# Patient Record
Sex: Female | Born: 1985 | Race: Black or African American | Hispanic: No | Marital: Single | Smoking: Current every day smoker
Health system: Southern US, Community
[De-identification: ages and names within clinical notes are randomized; demographics above are authoritative.]

## PROBLEM LIST (undated history)

## (undated) DIAGNOSIS — F419 Anxiety disorder, unspecified: Secondary | ICD-10-CM

## (undated) DIAGNOSIS — F319 Bipolar disorder, unspecified: Secondary | ICD-10-CM

## (undated) DIAGNOSIS — F32A Depression, unspecified: Secondary | ICD-10-CM

## (undated) DIAGNOSIS — I10 Essential (primary) hypertension: Secondary | ICD-10-CM

## (undated) DIAGNOSIS — D649 Anemia, unspecified: Secondary | ICD-10-CM

## (undated) DIAGNOSIS — F329 Major depressive disorder, single episode, unspecified: Secondary | ICD-10-CM

## (undated) HISTORY — DX: Bipolar disorder, unspecified: F31.9

## (undated) HISTORY — PX: DILATION AND CURETTAGE OF UTERUS: SHX78

---
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
## 2005-04-04 ENCOUNTER — Emergency Department: Payer: Self-pay | Admitting: Emergency Medicine

## 2005-06-20 ENCOUNTER — Emergency Department: Payer: Self-pay | Admitting: Emergency Medicine

## 2005-09-18 ENCOUNTER — Emergency Department: Payer: Self-pay | Admitting: Emergency Medicine

## 2006-06-23 ENCOUNTER — Emergency Department: Payer: Self-pay | Admitting: Internal Medicine

## 2006-07-12 ENCOUNTER — Emergency Department: Payer: Self-pay

## 2006-09-09 ENCOUNTER — Emergency Department: Payer: Self-pay | Admitting: Emergency Medicine

## 2007-01-07 ENCOUNTER — Emergency Department: Payer: Self-pay | Admitting: Emergency Medicine

## 2007-01-08 ENCOUNTER — Emergency Department: Payer: Self-pay | Admitting: Emergency Medicine

## 2007-09-08 ENCOUNTER — Emergency Department: Payer: Self-pay | Admitting: Emergency Medicine

## 2008-01-05 ENCOUNTER — Ambulatory Visit: Payer: Self-pay | Admitting: Internal Medicine

## 2008-04-27 ENCOUNTER — Observation Stay: Payer: Self-pay | Admitting: Obstetrics and Gynecology

## 2008-07-20 ENCOUNTER — Ambulatory Visit: Payer: Self-pay | Admitting: Urology

## 2008-08-26 ENCOUNTER — Observation Stay: Payer: Self-pay

## 2008-09-06 ENCOUNTER — Inpatient Hospital Stay: Payer: Self-pay

## 2008-09-21 ENCOUNTER — Ambulatory Visit: Payer: Self-pay

## 2008-09-23 ENCOUNTER — Ambulatory Visit: Payer: Self-pay | Admitting: Obstetrics & Gynecology

## 2008-09-24 ENCOUNTER — Ambulatory Visit: Payer: Self-pay | Admitting: Obstetrics & Gynecology

## 2009-08-15 ENCOUNTER — Emergency Department: Payer: Self-pay | Admitting: Internal Medicine

## 2010-05-27 ENCOUNTER — Emergency Department: Payer: Self-pay | Admitting: Emergency Medicine

## 2010-11-19 HISTORY — PX: ANKLE INCISION AND DRAINAGE: SHX1150

## 2010-12-04 ENCOUNTER — Emergency Department: Payer: Self-pay | Admitting: Emergency Medicine

## 2010-12-06 ENCOUNTER — Inpatient Hospital Stay: Payer: Self-pay | Admitting: Internal Medicine

## 2010-12-19 ENCOUNTER — Emergency Department: Payer: Self-pay | Admitting: Unknown Physician Specialty

## 2010-12-20 ENCOUNTER — Emergency Department: Payer: Self-pay | Admitting: *Deleted

## 2012-02-15 ENCOUNTER — Emergency Department: Payer: Self-pay | Admitting: Emergency Medicine

## 2012-04-24 ENCOUNTER — Emergency Department: Payer: Self-pay | Admitting: Emergency Medicine

## 2012-04-24 LAB — CBC
HCT: 41.9 % (ref 35.0–47.0)
HGB: 14.5 g/dL (ref 12.0–16.0)
MCH: 32.3 pg (ref 26.0–34.0)
MCHC: 34.5 g/dL (ref 32.0–36.0)
MCV: 94 fL (ref 80–100)
Platelet: 187 10*3/uL (ref 150–440)
RBC: 4.48 10*6/uL (ref 3.80–5.20)
RDW: 13.7 % (ref 11.5–14.5)
WBC: 9.8 10*3/uL (ref 3.6–11.0)

## 2012-04-24 LAB — URINALYSIS, COMPLETE
Bilirubin,UR: NEGATIVE
Blood: NEGATIVE
Glucose,UR: NEGATIVE mg/dL (ref 0–75)
Nitrite: NEGATIVE
Ph: 8 (ref 4.5–8.0)
RBC,UR: 3 /HPF (ref 0–5)
Squamous Epithelial: 14

## 2013-06-15 ENCOUNTER — Emergency Department: Payer: Self-pay | Admitting: Emergency Medicine

## 2013-06-15 LAB — CBC WITH DIFFERENTIAL/PLATELET
Basophil #: 0.1 10*3/uL (ref 0.0–0.1)
Basophil %: 0.8 %
Eosinophil #: 0.2 10*3/uL (ref 0.0–0.7)
Eosinophil %: 3.1 %
HCT: 42.1 % (ref 35.0–47.0)
HGB: 14.2 g/dL (ref 12.0–16.0)
LYMPHS PCT: 30 %
Lymphocyte #: 2.4 10*3/uL (ref 1.0–3.6)
MCH: 32 pg (ref 26.0–34.0)
MCHC: 33.8 g/dL (ref 32.0–36.0)
MCV: 95 fL (ref 80–100)
MONOS PCT: 6.5 %
Monocyte #: 0.5 x10 3/mm (ref 0.2–0.9)
NEUTROS PCT: 59.6 %
Neutrophil #: 4.7 10*3/uL (ref 1.4–6.5)
Platelet: 156 10*3/uL (ref 150–440)
RBC: 4.44 10*6/uL (ref 3.80–5.20)
RDW: 13.1 % (ref 11.5–14.5)
WBC: 7.8 10*3/uL (ref 3.6–11.0)

## 2013-06-15 LAB — COMPREHENSIVE METABOLIC PANEL
ALBUMIN: 4.3 g/dL (ref 3.4–5.0)
Alkaline Phosphatase: 47 U/L
Anion Gap: 5 — ABNORMAL LOW (ref 7–16)
BUN: 11 mg/dL (ref 7–18)
Bilirubin,Total: 0.4 mg/dL (ref 0.2–1.0)
CALCIUM: 9 mg/dL (ref 8.5–10.1)
Chloride: 108 mmol/L — ABNORMAL HIGH (ref 98–107)
Co2: 25 mmol/L (ref 21–32)
Creatinine: 0.96 mg/dL (ref 0.60–1.30)
EGFR (African American): >60
Glucose: 85 mg/dL (ref 65–99)
Osmolality: 274 (ref 275–301)
Potassium: 3.8 mmol/L (ref 3.5–5.1)
SGOT(AST): 16 U/L (ref 15–37)
SGPT (ALT): 33 U/L (ref 12–78)
SODIUM: 138 mmol/L (ref 136–145)
TOTAL PROTEIN: 7.4 g/dL (ref 6.4–8.2)

## 2013-06-15 LAB — GC/CHLAMYDIA PROBE AMP

## 2013-06-15 LAB — URINALYSIS, COMPLETE
Bacteria: NONE SEEN
Bilirubin,UR: NEGATIVE
Blood: NEGATIVE
Glucose,UR: NEGATIVE mg/dL (ref 0–75)
Ketone: NEGATIVE
Leukocyte Esterase: NEGATIVE
NITRITE: NEGATIVE
PH: 6 (ref 4.5–8.0)
PROTEIN: NEGATIVE
RBC,UR: <1 /HPF (ref 0–5)
Specific Gravity: 1.017 (ref 1.003–1.030)
WBC UR: 2 /HPF (ref 0–5)

## 2013-06-15 LAB — WET PREP, GENITAL

## 2013-06-15 LAB — HCG, QUANTITATIVE, PREGNANCY: BETA HCG, QUANT.: 435 m[IU]/mL — AB

## 2013-08-06 ENCOUNTER — Encounter: Payer: Self-pay | Admitting: Obstetrics & Gynecology

## 2013-08-06 LAB — CBC WITH DIFFERENTIAL/PLATELET
Basophil #: 0 10*3/uL (ref 0.0–0.1)
Basophil %: 0.4 %
EOS ABS: 0.4 10*3/uL (ref 0.0–0.7)
Eosinophil %: 3.5 %
HCT: 41.3 % (ref 35.0–47.0)
HGB: 14.1 g/dL (ref 12.0–16.0)
LYMPHS PCT: 12 %
Lymphocyte #: 1.3 10*3/uL (ref 1.0–3.6)
MCH: 31.8 pg (ref 26.0–34.0)
MCHC: 34.1 g/dL (ref 32.0–36.0)
MCV: 93 fL (ref 80–100)
Monocyte #: 0.4 x10 3/mm (ref 0.2–0.9)
Monocyte %: 4 %
NEUTROS ABS: 8.5 10*3/uL — AB (ref 1.4–6.5)
Neutrophil %: 80.1 %
Platelet: 165 10*3/uL (ref 150–440)
RBC: 4.44 10*6/uL (ref 3.80–5.20)
RDW: 13.3 % (ref 11.5–14.5)
WBC: 10.6 10*3/uL (ref 3.6–11.0)

## 2013-08-11 ENCOUNTER — Ambulatory Visit: Payer: Self-pay | Admitting: Obstetrics and Gynecology

## 2013-08-11 LAB — CBC WITH DIFFERENTIAL/PLATELET
Basophil #: 0 10*3/uL (ref 0.0–0.1)
Basophil %: 0.4 %
EOS PCT: 1 %
Eosinophil #: 0.1 10*3/uL (ref 0.0–0.7)
HCT: 39.5 % (ref 35.0–47.0)
HGB: 13.7 g/dL (ref 12.0–16.0)
LYMPHS ABS: 1.4 10*3/uL (ref 1.0–3.6)
Lymphocyte %: 13.9 %
MCH: 32 pg (ref 26.0–34.0)
MCHC: 34.7 g/dL (ref 32.0–36.0)
MCV: 92 fL (ref 80–100)
MONO ABS: 0.5 x10 3/mm (ref 0.2–0.9)
MONOS PCT: 5 %
NEUTROS PCT: 79.7 %
Neutrophil #: 8 10*3/uL — ABNORMAL HIGH (ref 1.4–6.5)
Platelet: 173 10*3/uL (ref 150–440)
RBC: 4.28 10*6/uL (ref 3.80–5.20)
RDW: 13.1 % (ref 11.5–14.5)
WBC: 10 10*3/uL (ref 3.6–11.0)

## 2013-08-11 LAB — BASIC METABOLIC PANEL
ANION GAP: 7 (ref 7–16)
BUN: 8 mg/dL (ref 7–18)
Calcium, Total: 8.8 mg/dL (ref 8.5–10.1)
Chloride: 104 mmol/L (ref 98–107)
Co2: 25 mmol/L (ref 21–32)
Creatinine: 0.8 mg/dL (ref 0.60–1.30)
EGFR (African American): >60
Glucose: 96 mg/dL (ref 65–99)
Osmolality: 270 (ref 275–301)
Potassium: 3.2 mmol/L — ABNORMAL LOW (ref 3.5–5.1)
Sodium: 136 mmol/L (ref 136–145)

## 2013-08-11 LAB — HCG, QUANTITATIVE, PREGNANCY: Beta Hcg, Quant.: 2991 m[IU]/mL — ABNORMAL HIGH

## 2013-08-15 LAB — PATHOLOGY REPORT

## 2014-01-26 ENCOUNTER — Encounter
Admit: 2014-01-26 | Disposition: A | Discharge status: Home / Self Care | Payer: Self-pay | Admitting: Obstetrics & Gynecology

## 2014-04-07 LAB — OB RESULTS CONSOLE GBS: STREP GROUP B AG: POSITIVE

## 2014-04-14 LAB — OB RESULTS CONSOLE PLATELET COUNT: Platelets: 218 10*3/uL

## 2014-04-14 LAB — OB RESULTS CONSOLE RUBELLA ANTIBODY, IGM: RUBELLA: NON-IMMUNE/NOT IMMUNE

## 2014-04-14 LAB — OB RESULTS CONSOLE GC/CHLAMYDIA
Chlamydia: NEGATIVE
GC PROBE AMP, GENITAL: NEGATIVE

## 2014-04-14 LAB — OB RESULTS CONSOLE ABO/RH: RH Type: POSITIVE

## 2014-04-14 LAB — OB RESULTS CONSOLE VARICELLA ZOSTER ANTIBODY, IGG: Varicella: IMMUNE

## 2014-04-14 LAB — OB RESULTS CONSOLE HIV ANTIBODY (ROUTINE TESTING): HIV: NONREACTIVE

## 2014-04-14 LAB — OB RESULTS CONSOLE ANTIBODY SCREEN: Antibody Screen: NEGATIVE

## 2014-04-14 LAB — OB RESULTS CONSOLE RPR: RPR: NONREACTIVE

## 2014-04-14 LAB — OB RESULTS CONSOLE HEPATITIS B SURFACE ANTIGEN: Hepatitis B Surface Ag: NEGATIVE

## 2014-05-21 NOTE — L&D Delivery Note (Signed)
Delivery Note At 4:12 AM a viable female was delivered via Vaginal, Spontaneous Delivery (Presentation: Left Occiput Anterior).  APGAR: 8, 9; weight 2800 g (6 lbs 2 oz) .   Placenta status: Piecemeal, Spontaneous removal of majority of placenta.  Cord avulsion after placenta partially beyond introitus.  Small segment missing.  Bedside ultrasound performed with confirmation of small segment of placenta at LUS. Manual removal.  Cytotec 800 mcg placed rectally. Cord: 3 vessels.  Anesthesia: Epidural  Episiotomy: None Lacerations: None Suture Repair: none Est. Blood Loss (mL): 400  Mom to postpartum.  Baby to Couplet care / Skin to Skin.  Mettie Roylance 10/06/2014, 5:04 AM

## 2014-05-25 ENCOUNTER — Emergency Department: Payer: Self-pay | Admitting: Emergency Medicine

## 2014-05-25 LAB — COMPREHENSIVE METABOLIC PANEL
ALBUMIN: 3 g/dL — AB (ref 3.4–5.0)
ALK PHOS: 49 U/L
ANION GAP: 2 — AB (ref 7–16)
AST: 11 U/L — AB (ref 15–37)
BUN: 6 mg/dL — AB (ref 7–18)
Bilirubin,Total: 0.2 mg/dL (ref 0.2–1.0)
CO2: 26 mmol/L (ref 21–32)
CREATININE: 0.71 mg/dL (ref 0.60–1.30)
Calcium, Total: 8 mg/dL — ABNORMAL LOW (ref 8.5–10.1)
Chloride: 107 mmol/L (ref 98–107)
EGFR (African American): >60
EGFR (Non-African Amer.): >60
Glucose: 76 mg/dL (ref 65–99)
Osmolality: 266 (ref 275–301)
Potassium: 3.9 mmol/L (ref 3.5–5.1)
SGPT (ALT): 18 U/L
SODIUM: 135 mmol/L — AB (ref 136–145)
Total Protein: 6.2 g/dL — ABNORMAL LOW (ref 6.4–8.2)

## 2014-05-25 LAB — URINALYSIS, COMPLETE
BACTERIA: NONE SEEN
BILIRUBIN, UR: NEGATIVE
BLOOD: NEGATIVE
Glucose,UR: NEGATIVE mg/dL (ref 0–75)
Ketone: NEGATIVE
Nitrite: NEGATIVE
Ph: 6 (ref 4.5–8.0)
Protein: NEGATIVE
Specific Gravity: 1.021 (ref 1.003–1.030)
WBC UR: 1 /HPF (ref 0–5)

## 2014-05-25 LAB — CBC WITH DIFFERENTIAL/PLATELET
BASOS PCT: 0.5 %
Basophil #: 0.1 10*3/uL (ref 0.0–0.1)
Eosinophil #: 0.2 10*3/uL (ref 0.0–0.7)
Eosinophil %: 2 %
HCT: 36.1 % (ref 35.0–47.0)
HGB: 12.1 g/dL (ref 12.0–16.0)
Lymphocyte #: 1.6 10*3/uL (ref 1.0–3.6)
Lymphocyte %: 16.5 %
MCH: 32.1 pg (ref 26.0–34.0)
MCHC: 33.6 g/dL (ref 32.0–36.0)
MCV: 96 fL (ref 80–100)
MONOS PCT: 5.3 %
Monocyte #: 0.5 x10 3/mm (ref 0.2–0.9)
NEUTROS ABS: 7.3 10*3/uL — AB (ref 1.4–6.5)
NEUTROS PCT: 75.7 %
Platelet: 154 10*3/uL (ref 150–440)
RBC: 3.78 10*6/uL — AB (ref 3.80–5.20)
RDW: 13 % (ref 11.5–14.5)
WBC: 9.7 10*3/uL (ref 3.6–11.0)

## 2014-05-25 LAB — LIPASE, BLOOD: LIPASE: 168 U/L (ref 73–393)

## 2014-05-31 ENCOUNTER — Emergency Department: Payer: Self-pay | Admitting: Emergency Medicine

## 2014-05-31 LAB — CBC
HCT: 36.5 % (ref 35.0–47.0)
HGB: 12.1 g/dL (ref 12.0–16.0)
MCH: 31.5 pg (ref 26.0–34.0)
MCHC: 33.3 g/dL (ref 32.0–36.0)
MCV: 95 fL (ref 80–100)
Platelet: 156 10*3/uL (ref 150–440)
RBC: 3.85 10*6/uL (ref 3.80–5.20)
RDW: 13.2 % (ref 11.5–14.5)
WBC: 13 10*3/uL — AB (ref 3.6–11.0)

## 2014-05-31 LAB — BASIC METABOLIC PANEL
Anion Gap: 7 (ref 7–16)
BUN: 7 mg/dL (ref 7–18)
CALCIUM: 8 mg/dL — AB (ref 8.5–10.1)
Chloride: 107 mmol/L (ref 98–107)
Co2: 23 mmol/L (ref 21–32)
Creatinine: 0.73 mg/dL (ref 0.60–1.30)
EGFR (African American): >60
EGFR (Non-African Amer.): >60
GLUCOSE: 81 mg/dL (ref 65–99)
Osmolality: 271 (ref 275–301)
POTASSIUM: 3.5 mmol/L (ref 3.5–5.1)
Sodium: 137 mmol/L (ref 136–145)

## 2014-05-31 LAB — WET PREP, GENITAL

## 2014-06-01 LAB — GC/CHLAMYDIA PROBE AMP

## 2014-06-05 ENCOUNTER — Observation Stay: Payer: Self-pay | Admitting: Obstetrics and Gynecology

## 2014-09-11 NOTE — Op Note (Signed)
PATIENT NAME:  Debbie Padilla, [name] MR#:  [id number] DATE OF BIRTH:  1985/10/22  DATE OF PROCEDURE:  08/11/2013  PREOPERATIVE DIAGNOSIS: Missed spontaneous abortion.   POSTOPERATIVE DIAGNOSIS: Missed spontaneous abortion.   PROCEDURE: Suction dilation and curettage.   ANESTHESIA: General.   SURGEON: Thomasene MohairStephen Roslyn Else, MD   ESTIMATED BLOOD LOSS: 100 mL.  OPERATIVE FLUIDS: 500 mL crystalloid.   COMPLICATIONS: None.   FINDINGS: An approximately 8 week size uterus with apparent products of conception.   SPECIMENS: Endometrial curettings and apparent products of conception.   CONDITION AT END OF PROCEDURE: Stable.   PROCEDURE IN DETAIL: The patient was taken to the operating room and placed under general anesthesia, which was found to be adequate. She was placed in the dorsal supine high lithotomy position and prepped and draped in the usual sterile fashion. After a timeout was called, an in-and-out catheterization was performed for minimal return of approximately 5 mL of clear urine. A sterile speculum was placed in the vagina and a single-tooth tenaculum was used to grasp the anterior lip of the cervix. The size of the uterus was consistent with an 8 week size uterus and an 8 mm suction curette was then gently introduced through the cervix after it was dilated using Hegar dilators to approximately 9 mm without difficulty. Suction curette was then used for 3 passes with good return of products. A rigid curette was then used throughout the uterine cavity until a gritty texture was noted throughout. One final pass of the suction curette was utilized to obtain any loosened up tissue after the rigid curette. This concluded the procedure. There was good hemostasis with only minimal bleeding from the cervical os. The single-tooth tenaculum was removed from the cervix, and these sites were hemostatic. The vagina was inspected, and no instruments or sponges were left inside the vagina. The speculum was then  removed.   The patient tolerated the procedure well. Sponge, lap, and needle counts were correct x 2. For antibiotic prophylaxis, the patient received 100 mg of IV doxycycline on call to the OR. She was awakened in the operating room and taken to the recovery area in stable condition.     ____________________________ Conard NovakStephen D. Sidnee Gambrill, MD sdj:jcm D: 08/11/2013 15:16:06 ET T: 08/11/2013 17:23:04 ET JOB#: [id number]  cc: Conard NovakStephen D. Ra Pfiester, MD, <Dictator> Conard NovakSTEPHEN D Matheu Ploeger MD ELECTRONICALLY SIGNED 08/15/2013 8:23

## 2014-09-11 NOTE — H&P (Signed)
Subjective/Chief Complaint Vaginal Bleeding   History of Present Illness 29 yo [id number] at approximately [redacted] weeks gestational age presenting to ER for vaginal bleeding after going to restroom to urinate this morning.  She had one gush, then a clump of blood.  She has been passing some small amounts of blood since then.  She notes ongoing cramping since that time.  She has previously had a anembryonic pregnancy diagnosed and was scheduled to undergo suction dilation and curettage today with Dr. Bonney Aid.  Due to the above, she presented to the emergency room at [facility] instead in the hopes that she had already passed the pregnancy at home and would not require a surgical procedure.   Past History endometriosis   Past Med/Surgical Hx:  Endometriosis:   D&C:   ALLERGIES:  No Known Allergies:    Medications No medications   Family and Social History:  Family History Non-Contributory   Social History positive  tobacco, 5 cigs/day, occasional EtOH, no drug use   + Tobacco Current (within 1 year)   Review of Systems:  Fever/Chills No   Cough No   Sputum No   Abdominal Pain No   Diarrhea No   Constipation No   Nausea/Vomiting No   SOB/DOE No   Chest Pain No   Dysuria No   Physical Exam:  GEN well developed, well nourished, no acute distress   NECK supple  No masses  trachea midline   RESP normal resp effort  clear BS  no use of accessory muscles   CARD regular rate  no murmur  regular rhythm   ABD denies tenderness  soft  normal BS  no Adominal Mass   GU no superpubic tenderness   EXTR negative cyanosis/clubbing, negative edema   SKIN normal to palpation   NEURO motor/sensory function intact   PSYCH A+O to time, place, person, good insight   Additional Comments Temperature : 98.4 degrees F Temp Source : oral Pulse : 68  Respirations : 18  SBP : 109  DBP : 52  Pulse Ox % : 98 % Pulse Ox Source Source : Room Air Pain Scale (0-10) : Scale:9   Lab  Results: Routine BB:  24-Mar-15 11:12   ABO Group + Rh Type O Positive  Antibody Screen NEGATIVE (Result(s) reported on 11 Aug 2013 at 12:19PM.)  Routine Chem:  24-Mar-15 10:00   HCG Betasubunit Quant. Serum  2991 (1-3  (International Unit)  ----------------- Non-pregnant <5 Weeks Post LMP mIU/mL  3- 4 wk 9 - 130  4- 5 wk 75 - 2,600  5- 6 wk 850 - 20,800  6- 7 wk 4,000 - 100,000  7-12 wk 11,500 - 289,000 12-16 wk 18,000 - 137,000 16-29 wk 1,400 - 53,000 29-41 wk 940 - 60,000)  Glucose, Serum 96  BUN 8  Creatinine (comp) 0.80  Sodium, Serum 136  Potassium, Serum  3.2  Chloride, Serum 104  CO2, Serum 25  Calcium (Total), Serum 8.8  Anion Gap 7  Osmolality (calc) 270  eGFR (African American) >60  eGFR (Non-African American) >60 (eGFR values <71mL/min/1.73 m2 may be an indication of chronic kidney disease (CKD). Calculated eGFR is useful in patients with stable renal function. The eGFR calculation will not be reliable in acutely ill patients when serum creatinine is changing rapidly. It is not useful in  patients on dialysis. The eGFR calculation may not be applicable to patients at the low and high extremes of body sizes, pregnant women, and vegetarians.)  Routine  Hem:  24-Mar-15 10:00   WBC (CBC) 10.0  RBC (CBC) 4.28  Hemoglobin (CBC) 13.7  Hematocrit (CBC) 39.5  Platelet Count (CBC) 173  MCV 92  MCH 32.0  MCHC 34.7  RDW 13.1  Neutrophil % 79.7  Lymphocyte % 13.9  Monocyte % 5.0  Eosinophil % 1.0  Basophil % 0.4  Neutrophil #  8.0  Lymphocyte # 1.4  Monocyte # 0.5  Eosinophil # 0.1  Basophil # 0.0 (Result(s) reported on 11 Aug 2013 at 11:18AM.)   Radiology Results: [place]:    24-Mar-15 13:00, US OB Less Than 14 Weeks  US OB Less Than 14 Weeks  REASON FOR EXAM:    possible miscarriage  COMMENTS:       PROCEDURE: [place]  - US OB LESS THAN 14 WEEKS  - Aug 11 2013  1:00PM     CLINICAL DATA:  Miscarriage. Scheduled for D and C today, evaluate  for retained  products.    EXAM:  OBSTETRIC <14 WK ULTRASOUND    TECHNIQUE:  Transabdominal ultrasound was performed for evaluation of the  gestation as well as the maternal uterus and adnexal regions.  COMPARISON:  Outside Pelvic ultrasound 08/06/2013    FINDINGS:  Intrauterine gestational sac: Present.  The sac shape is irregular.    Yolk sac:  Not seen    Fetal pole: Not seen.    MSD: 32  mm   8 w   3  d    Maternal uterus/adnexae: Symmetric and normal size ovaries. No  significant free pelvic fluid. No subchorionic hematoma.     IMPRESSION:  Empty intrauterine gestational sac, 32 mm mean sac diameter.  Findings meet definitive criteria for failed pregnancy. This follows  SRU consensus guidelines: Diagnostic Criteria for Nonviable  Pregnancy Early in the First Trimester. Alison Stalling J Med  [phone].      Electronically Signed    By: Jorje Guild M.D.    On: 08/11/2013 13:06         Verified By: Gilford Silvius, M.D.,    Assessment/Admission Diagnosis 29 yo [phone] with anembryonic pregnancy and missed abortion   Plan Discussed managment strategies with patient, including; no medical intervention and allow her body to naturally pass the pregnancy, medication-induced passage of products of conception, and surgical with dilation and curettage.  After thoroughly discussing the risks/benefits of each approach patient elects surgical management.  I discussed all risks, including risk of uterine perforation potentially requiring additional surgery and discussed formation of adhesions in uterus leading to future issues with becoming pregnant and with menstrual irregularities.  I also consented the patient for a blood transfusion in an emergent situation, should that situation arise.  Patient voiced desire to receive transfusion should need arise.  Patient is NPO. Will take to OR. OR/anesthesia is aware.   Electronic Signatures: Will Bonnet (MD)  (Signed 24-Mar-15  14:09)  Authored: CHIEF COMPLAINT and HISTORY, PAST MEDICAL/SURGIAL HISTORY, ALLERGIES, OTHER MEDICATIONS, FAMILY AND SOCIAL HISTORY, REVIEW OF SYSTEMS, PHYSICAL EXAM, LABS, Radiology, ASSESSMENT AND PLAN   Last Updated: 24-Mar-15 14:09 by Will Bonnet (MD)

## 2014-09-13 ENCOUNTER — Observation Stay
Admit: 2014-09-13 | Disposition: A | Discharge status: Home / Self Care | Payer: Self-pay | Attending: Obstetrics and Gynecology | Admitting: Obstetrics and Gynecology

## 2014-09-13 LAB — PIH PROFILE
ANION GAP: 8 (ref 7–16)
CREATININE: 0.52 mg/dL
Calcium, Total: 8.5 mg/dL — ABNORMAL LOW
Chloride: 106 mmol/L
Co2: 19 mmol/L — ABNORMAL LOW
EGFR (Non-African Amer.): >60
Glucose: 85 mg/dL
HCT: 33.8 % — ABNORMAL LOW (ref 35.0–47.0)
HGB: 11.6 g/dL — AB (ref 12.0–16.0)
MCH: 32 pg (ref 26.0–34.0)
MCHC: 34.4 g/dL (ref 32.0–36.0)
MCV: 93 fL (ref 80–100)
POTASSIUM: 3.9 mmol/L
Platelet: 170 10*3/uL (ref 150–440)
RBC: 3.63 10*6/uL — ABNORMAL LOW (ref 3.80–5.20)
RDW: 13.1 % (ref 11.5–14.5)
SGOT(AST): 19 U/L
SODIUM: 133 mmol/L — AB
Uric Acid: 3.5 mg/dL
WBC: 12.3 10*3/uL — ABNORMAL HIGH (ref 3.6–11.0)

## 2014-09-13 LAB — URINALYSIS, COMPLETE
BLOOD: NEGATIVE
Bilirubin,UR: NEGATIVE
Glucose,UR: NEGATIVE mg/dL (ref 0–75)
NITRITE: NEGATIVE
PH: 7 (ref 4.5–8.0)
Protein: NEGATIVE
Specific Gravity: 1.013 (ref 1.003–1.030)

## 2014-09-13 LAB — PROTEIN / CREATININE RATIO, URINE
CREATININE, URINE RANDOM: 65 mg/dL (ref 30–125)
Protein, Urine: 11 mg/dL (ref 0–9)
Protein/Creat. Ratio: 169 mg/gCREAT (ref 0–200)

## 2014-09-28 NOTE — H&P (Signed)
L&D Evaluation:  History:  HPI Debbie Padilla is a 29 y.o. [id number] single AAF, LMP 01/15/2014, EGA 20.1 weeks, EDD 10/22/14 who presents with c/o lower abdominal pain, contractions, and vaginal spotting. Patient has had spotting oer the past week, currently old brown blood. Notes good fetal movement.  Has taken 3 doses of Tylenol at home prior to arrival.   Presents with abdominal pain, contractions   Patient's Medical History Bipolar disorder, h/o UTI in pregnancy   Patient's Surgical History none   Medications Pre Natal Vitamins  Trazadone, Celexa   Allergies NKDA   Social History tobacco  h/o marijuana use prior to pregnancy, 2-3 cig every other day   Family History Non-Contributory   ROS:  ROS All systems were reviewed.  HEENT, CNS, GI, GU, Respiratory, CV, Renal and Musculoskeletal systems were found to be normal.   Exam:  Vital Signs stable   Urine Protein not completed   General mild distress   Mental Status clear   Chest clear   Heart normal sinus rhythm   Abdomen gravid, non-tender   Estimated Fetal Weight Average for gestational age   Back no CVAT   Pelvic no external lesions, cervix closed and thick   Mebranes Intact   FHT FHT appreciated   Ucx absent, uterine irritability noted   Skin dry, no lesions   Impression:  Impression UTI   Plan:  Comments Patient with known UTI diagnosed in office several days ago (EA.Coli).  Patient has not yet initiated treatment.  Will give dose of Rocephin today, 1g, and advised patient to pick up prescriiption for Macrobid at local pharmacy.  Will give dose of Dilaudid 2 mg PO x 1 dose for pain (as patient has taken several doses of Tylenol today, cannot recall timing of last dose). Can d/c home.   Follow Up Appointment in 4 weeks   Electronic Signatures: Fabian Novemberherry, Jekhi Bolin S (MD)  (Signed 16-Jan-16 13:21)  Authored: L&D Evaluation   Last Updated: 16-Jan-16 13:21 by Fabian Novemberherry, Bijou Easler S (MD)

## 2014-10-05 ENCOUNTER — Inpatient Hospital Stay
Admission: RE | Admit: 2014-10-05 | Discharge: 2014-10-08 | DRG: 767 | Disposition: A | Discharge status: Home / Self Care | Payer: Medicaid Other | Attending: Obstetrics and Gynecology | Admitting: Obstetrics and Gynecology

## 2014-10-05 ENCOUNTER — Inpatient Hospital Stay: Payer: Medicaid Other | Admitting: Anesthesiology

## 2014-10-05 DIAGNOSIS — Z3A37 37 weeks gestation of pregnancy: Secondary | ICD-10-CM | POA: Diagnosis present

## 2014-10-05 DIAGNOSIS — O99333 Smoking (tobacco) complicating pregnancy, third trimester: Secondary | ICD-10-CM | POA: Diagnosis present

## 2014-10-05 DIAGNOSIS — Z825 Family history of asthma and other chronic lower respiratory diseases: Secondary | ICD-10-CM

## 2014-10-05 DIAGNOSIS — Z720 Tobacco use: Secondary | ICD-10-CM

## 2014-10-05 DIAGNOSIS — O99824 Streptococcus B carrier state complicating childbirth: Secondary | ICD-10-CM | POA: Diagnosis present

## 2014-10-05 DIAGNOSIS — Z833 Family history of diabetes mellitus: Secondary | ICD-10-CM | POA: Diagnosis not present

## 2014-10-05 DIAGNOSIS — F17210 Nicotine dependence, cigarettes, uncomplicated: Secondary | ICD-10-CM | POA: Diagnosis present

## 2014-10-05 DIAGNOSIS — O4292 Full-term premature rupture of membranes, unspecified as to length of time between rupture and onset of labor: Principal | ICD-10-CM | POA: Diagnosis present

## 2014-10-05 DIAGNOSIS — O2342 Unspecified infection of urinary tract in pregnancy, second trimester: Secondary | ICD-10-CM

## 2014-10-05 DIAGNOSIS — O4290 Premature rupture of membranes, unspecified as to length of time between rupture and onset of labor, unspecified weeks of gestation: Secondary | ICD-10-CM | POA: Diagnosis present

## 2014-10-05 HISTORY — DX: Anxiety disorder, unspecified: F41.9

## 2014-10-05 LAB — CBC
HCT: 31.1 % — ABNORMAL LOW (ref 35.0–47.0)
Hemoglobin: 10.5 g/dL — ABNORMAL LOW (ref 12.0–16.0)
MCH: 31.1 pg (ref 26.0–34.0)
MCHC: 33.7 g/dL (ref 32.0–36.0)
MCV: 92.3 fL (ref 80.0–100.0)
PLATELETS: 162 10*3/uL (ref 150–440)
RBC: 3.37 MIL/uL — ABNORMAL LOW (ref 3.80–5.20)
RDW: 13 % (ref 11.5–14.5)
WBC: 11.6 10*3/uL — AB (ref 3.6–11.0)

## 2014-10-05 LAB — TYPE AND SCREEN
ABO/RH(D): O POS
Antibody Screen: NEGATIVE

## 2014-10-05 LAB — ABO/RH: ABO/RH(D): O POS

## 2014-10-05 MED ORDER — LACTATED RINGERS IV SOLN
INTRAVENOUS | Status: DC
  Administered 2014-10-05 (×3): via INTRAVENOUS

## 2014-10-05 MED ORDER — MISOPROSTOL 200 MCG PO TABS
ORAL_TABLET | ORAL | Status: AC
  Filled 2014-10-05: qty 4

## 2014-10-05 MED ORDER — LIDOCAINE HCL (PF) 2 % IJ SOLN
INTRAMUSCULAR | Status: DC | PRN
  Administered 2014-10-05: 3 mL

## 2014-10-05 MED ORDER — LIDOCAINE HCL (PF) 1 % IJ SOLN: 30.0000 mL | INTRAMUSCULAR | Status: DC | PRN

## 2014-10-05 MED ORDER — OXYTOCIN 10 UNIT/ML IJ SOLN
INTRAMUSCULAR | Status: AC
  Filled 2014-10-05: qty 2

## 2014-10-05 MED ORDER — AMMONIA AROMATIC IN INHA
RESPIRATORY_TRACT | Status: AC
  Filled 2014-10-05: qty 10

## 2014-10-05 MED ORDER — TERBUTALINE SULFATE 1 MG/ML IJ SOLN: 0.2500 mg | Freq: Once | INTRAMUSCULAR | Status: AC | PRN

## 2014-10-05 MED ORDER — OXYCODONE-ACETAMINOPHEN 5-325 MG PO TABS: 2.0000 | ORAL_TABLET | ORAL | Status: DC | PRN

## 2014-10-05 MED ORDER — OXYTOCIN BOLUS FROM INFUSION
500.0000 mL | INTRAVENOUS | Status: DC
  Administered 2014-10-06: 500 mL via INTRAVENOUS

## 2014-10-05 MED ORDER — SODIUM CHLORIDE 0.9 % IV SOLN
INTRAVENOUS | Status: AC
  Administered 2014-10-05: 1 g via INTRAVENOUS
  Filled 2014-10-05: qty 1000

## 2014-10-05 MED ORDER — LACTATED RINGERS IV SOLN: 500.0000 mL | INTRAVENOUS | Status: DC | PRN

## 2014-10-05 MED ORDER — BUPIVACAINE HCL (PF) 0.25 % IJ SOLN
INTRAMUSCULAR | Status: DC | PRN
  Administered 2014-10-05: 8 mL

## 2014-10-05 MED ORDER — SODIUM CHLORIDE 0.9 % IV SOLN
1.0000 g | INTRAVENOUS | Status: DC
  Administered 2014-10-05 – 2014-10-06 (×4): 1 g via INTRAVENOUS

## 2014-10-05 MED ORDER — SODIUM CHLORIDE 0.9 % IV SOLN
INTRAVENOUS | Status: AC
  Administered 2014-10-05: 2 g via INTRAVENOUS
  Filled 2014-10-05: qty 2000

## 2014-10-05 MED ORDER — ACETAMINOPHEN 325 MG PO TABS: 650.0000 mg | ORAL_TABLET | ORAL | Status: DC | PRN

## 2014-10-05 MED ORDER — OXYTOCIN 40 UNITS IN LACTATED RINGERS INFUSION - SIMPLE MED
INTRAVENOUS | Status: AC
  Administered 2014-10-05: 1 m[IU]/min via INTRAVENOUS
  Filled 2014-10-05: qty 1000

## 2014-10-05 MED ORDER — SODIUM CHLORIDE 0.9 % IV SOLN
2.0000 g | Freq: Once | INTRAVENOUS | Status: AC
  Administered 2014-10-05: 2 g via INTRAVENOUS

## 2014-10-05 MED ORDER — ONDANSETRON HCL 4 MG/2ML IJ SOLN: 4.0000 mg | Freq: Four times a day (QID) | INTRAMUSCULAR | Status: DC | PRN

## 2014-10-05 MED ORDER — FENTANYL 2.5 MCG/ML W/ROPIVACAINE 0.2% IN NS 100 ML EPIDURAL INFUSION (ARMC-ANES)
EPIDURAL | Status: AC
  Administered 2014-10-05 (×2): 8 mL/h via EPIDURAL
  Filled 2014-10-05: qty 100

## 2014-10-05 MED ORDER — OXYCODONE-ACETAMINOPHEN 5-325 MG PO TABS: 1.0000 | ORAL_TABLET | ORAL | Status: DC | PRN

## 2014-10-05 MED ORDER — LIDOCAINE HCL (PF) 1 % IJ SOLN
INTRAMUSCULAR | Status: AC
  Filled 2014-10-05: qty 30

## 2014-10-05 MED ORDER — OXYTOCIN 40 UNITS IN LACTATED RINGERS INFUSION - SIMPLE MED
1.0000 m[IU]/min | INTRAVENOUS | Status: DC
Start: 2014-10-05 — End: 2014-10-06
  Administered 2014-10-05: 1 m[IU]/min via INTRAVENOUS

## 2014-10-05 MED ORDER — LIDOCAINE-EPINEPHRINE (PF) 1.5 %-1:200000 IJ SOLN
INTRAMUSCULAR | Status: DC | PRN
  Administered 2014-10-05: 3 mL

## 2014-10-05 MED ORDER — AMPICILLIN SODIUM 1 G IJ SOLR
INTRAMUSCULAR | Status: AC
  Administered 2014-10-05: 1 g via INTRAVENOUS
  Filled 2014-10-05: qty 1000

## 2014-10-05 MED ORDER — OXYTOCIN 40 UNITS IN LACTATED RINGERS INFUSION - SIMPLE MED: 62.5000 mL/h | INTRAVENOUS | Status: DC

## 2014-10-05 NOTE — H&P (Signed)
HPI: [name] is a 29 y.o. [id number] at [redacted]w[redacted]d, Patient's last menstrual period was 01/14/2014., with EDD of 10/21/2014, by Last Menstrual Period who presents for leakage of fluids since 0400. Patient denies contractions, bleeding, decreased fetal movement.    OBHx:  OB History  Gravida Para Term Preterm AB SAB TAB Ectopic Multiple Living  $Remov'6 2 1 1 3 2 1   2    'XCnjiH$ # Outcome Date GA Lbr Len/2nd Weight Sex Delivery Anes PTL Lv  6 Current (pregnancy complicated by placenta previa, resolved at 32 weeks)           5 SAB 08/04/13          4 SAB 11/04/08          3 Preterm 09/06/08 [redacted]w[redacted]d   F Vag-Spont EPI  Y  2 Term 02/16/04 [redacted]w[redacted]d   F Vag-Spont EPI  Y  1 TAB 11/03/02              GYNHx:  . PMH:  Past Medical History  Diagnosis Date  . Preterm labor 2010  . Anemia of pregnancy 2016  . Anxiety   . UTI in pregnancy (E.Coli) 04/2014    SurgHx:  Past Surgical History  Procedure Laterality Date  . Ankle incision and drainage Left July 2012  . Dilation and curettage of uterus      Family Hx:  Family History  Problem Relation Age of Onset  . Asthma Daughter   . Asthma Paternal Grandmother   . Diabetes Paternal Grandmother     SocialHx:  History   Social History  . Marital Status: Single    Spouse Name: N/A  . Number of Children: N/A  . Years of Education: N/A   Occupational History  . Not on file.   Social History Main Topics  . Smoking status: Current Every Day Smoker -- 0.25 packs/day  . Smokeless tobacco: Never Used  . Alcohol Use: No  . Drug Use: No  . Sexual Activity: Yes    Birth Control/ Protection: None   Other Topics Concern  . Not on file   Social History Narrative  . No narrative on file    Allergies:  No Known Allergies  Medications:  Ferrous Sulfate 1 tab daily Phenergan 25 mg q 6 hr prn Recti-Care 5% cream, 4 x daily prn  Physical Exam:  Temperature 98.1 F (36.7 C), temperature source Oral, resp. rate 16, height $RemoveBe'5\' 3"'qiRXOyJIo$   (1.6 m), weight 78.019 kg (172 lb), last menstrual period 01/14/2014.  Physical Exam  Constitutional: She is oriented to person, place, and time and well-developed, well-nourished, and in no distress.  HENT:  Head: Normocephalic and atraumatic.  Neck: Normal range of motion. Neck supple. No thyromegaly present.  Cardiovascular: Normal rate, regular rhythm and normal heart sounds.  Pulmonary/Chest: Effort normal and breath sounds normal.  Abdominal: Soft. There is no tenderness.  Gravid. FHT: Baseline140 , accels present, no decels.  Toco: no contractions  Genitourinary: Vagina normal and cervix normal. No vaginal discharge found.  Cervix: 2/40-50/c/-3/SROM (gross fluid) Musculoskeletal: Normal range of motion.  Neurological: She is alert and oriented to person, place, and time.  Skin: Skin is warm and dry. No rash noted.  Psychiatric: Affect normal.    Labs:  O+/-/HIV-/ND/NR/VZI/RNI/GC-/Cl-/GBS+  Lab Results  Component Value Date   WBC 11.6* 10/05/2014   HGB 10.5* 10/05/2014   HCT 31.1* 10/05/2014   MCV 92.3 10/05/2014   PLT 162 10/05/2014   RGS wnl; pap neg  U/S:  06/18/14: [place] age 21.0 weeks, EDD 10/22/14, EFW 44%. Marginal previa (3.5 mm from internal os). Female gender. Breech presentation  Assessment:  1. SIUP at [redacted]w[redacted]d 2. GBS positive 3. PROM, clear fluid 4. H/o UTI in pregnancy 5. H/o placenta previa in pregnancy, resolved at 32 weeks.   Plan:  1. Admit to Labor and Delivery 2. Pitocin for augmentation of labor. Follow protocol. 3. Epidural if desired. Stadol for IV pain until epidural requested. 4. Admission labs 5. GBS prophylaxis with Ampicillin.  6. MMR vaccine postpartum   Rubie Maid, MD 10/05/2014 1:03 PM

## 2014-10-05 NOTE — Progress Notes (Signed)
Intrapartum Progress Note  S: Doing ok.  Notes mild pain.   O: Blood pressure 131/77, pulse 75, temperature 98 F (36.7 C), temperature source Oral, resp. rate 16, height 5\' 3"  (1.6 m), weight 78.019 kg (172 lb), last menstrual period 01/14/2014. Gen App: NAD, comfortable Abdomen: soft, gravid FHT: 145 bpm.  Accels present. No decels.  Tocometer: ctx q 3-6 min Cervix: deferred due to PROM status Extremities: Nontender, no edema.  Pitocin: 6 mIU  Labs:  No new labs  Assessment:  1: SIUP at 153w5d 2. GBS+ 3. Latent labor  Plan:  1) Continue Pitocin for active management 2) Continue Ampicillin for GBS + status 3) Limit cervical checks until active labor ensues  Debbie LaserAnika Akire Rennert, MD 10/05/2014 6:41 PM

## 2014-10-05 NOTE — Anesthesia Procedure Notes (Signed)
Epidural Patient location during procedure: OB Start time: 10/05/2014 8:45 PM End time: 10/05/2014 8:58 PM  Staffing Anesthesiologist: Yves DillARROLL, Nalini Alcaraz  Preanesthetic Checklist Completed: patient identified, site marked, pre-op evaluation, timeout performed, IV checked, risks and benefits discussed and monitors and equipment checked  Epidural Patient position: sitting Prep: Betadine Patient monitoring: heart rate, cardiac monitor, continuous pulse ox and blood pressure Approach: midline Location: L3-L4 Injection technique: LOR air  Needle:  Needle type: Tuohy  Needle gauge: 18 G Needle length: 9 cm Needle insertion depth: 4 cm Catheter type: closed end flexible Catheter size: 20 Guage Catheter at skin depth: 8 cm Test dose: negative  Assessment Sensory level: T9  Additional Notes Procedure performed under sterile prep and drape.. Skin wheal made with 1% Lidocaine plain at the L3-L4 interspace with Easy access to the epidural space..   First catheter threaded into vein.  Repositioned with the easy thread and neg test dose of the second catheter.  Patient tolerated the procedure well.Reason for block:procedure for pain

## 2014-10-05 NOTE — Anesthesia Preprocedure Evaluation (Addendum)
Anesthesia Evaluation  Patient identified by MRN, date of birth, ID band Patient awake    Airway Mallampati: II  TM Distance: >3 FB Neck ROM: Full    Dental no notable dental hx.    Pulmonary neg pulmonary ROS, Current Smoker,  breath sounds clear to auscultation  Pulmonary exam normal       Cardiovascular negative cardio ROS Normal cardiovascular examRhythm:Regular Rate:Normal     Neuro/Psych Anxiety negative neurological ROS     GI/Hepatic negative GI ROS, Neg liver ROS,   Endo/Other  negative endocrine ROS  Renal/GU negative Renal ROS  negative genitourinary   Musculoskeletal negative musculoskeletal ROS (+)   Abdominal Normal abdominal exam  (+)   Peds negative pediatric ROS (+)  Hematology negative hematology ROS (+)   Anesthesia Other Findings   Reproductive/Obstetrics negative OB ROS                           Anesthesia Physical Anesthesia Plan  ASA: II  Anesthesia Plan: Epidural   Post-op Pain Management:    Induction:   Airway Management Planned: Natural Airway  Additional Equipment:   Intra-op Plan:   Post-operative Plan:   Informed Consent: I have reviewed the patients History and Physical, chart, labs and discussed the procedure including the risks, benefits and alternatives for the proposed anesthesia with the patient or authorized representative who has indicated his/her understanding and acceptance.   Dental advisory given  Plan Discussed with: Surgeon  Anesthesia Plan Comments:         Anesthesia Quick Evaluation

## 2014-10-05 NOTE — OB Triage Note (Signed)
Pt complaint of leaking fluid since 0400

## 2014-10-06 DIAGNOSIS — O2342 Unspecified infection of urinary tract in pregnancy, second trimester: Secondary | ICD-10-CM

## 2014-10-06 DIAGNOSIS — Z720 Tobacco use: Secondary | ICD-10-CM

## 2014-10-06 LAB — RPR: RPR Ser Ql: NONREACTIVE

## 2014-10-06 MED ORDER — SIMETHICONE 80 MG PO CHEW: 80.0000 mg | CHEWABLE_TABLET | ORAL | Status: DC | PRN

## 2014-10-06 MED ORDER — SENNOSIDES-DOCUSATE SODIUM 8.6-50 MG PO TABS
2.0000 | ORAL_TABLET | ORAL | Status: DC
  Administered 2014-10-07: 2 via ORAL
  Filled 2014-10-06 (×2): qty 2

## 2014-10-06 MED ORDER — LANOLIN HYDROUS EX OINT: TOPICAL_OINTMENT | CUTANEOUS | Status: DC | PRN

## 2014-10-06 MED ORDER — METHYLERGONOVINE MALEATE 0.2 MG PO TABS: 0.2000 mg | ORAL_TABLET | ORAL | Status: DC | PRN

## 2014-10-06 MED ORDER — WITCH HAZEL-GLYCERIN EX PADS: 1.0000 "application " | MEDICATED_PAD | CUTANEOUS | Status: DC | PRN

## 2014-10-06 MED ORDER — IBUPROFEN 600 MG PO TABS
ORAL_TABLET | ORAL | Status: AC
  Administered 2014-10-06: 600 mg via ORAL
  Filled 2014-10-06: qty 1

## 2014-10-06 MED ORDER — OXYCODONE-ACETAMINOPHEN 5-325 MG PO TABS
2.0000 | ORAL_TABLET | ORAL | Status: DC | PRN
  Administered 2014-10-06 – 2014-10-08 (×11): 2 via ORAL
  Filled 2014-10-06 (×11): qty 2

## 2014-10-06 MED ORDER — MISOPROSTOL 200 MCG PO TABS
800.0000 ug | ORAL_TABLET | Freq: Once | ORAL | Status: AC
Start: 2014-10-06 — End: 2014-10-06
  Administered 2014-10-06: 05:00:00 via RECTAL

## 2014-10-06 MED ORDER — FERROUS SULFATE 325 (65 FE) MG PO TABS
325.0000 mg | ORAL_TABLET | Freq: Two times a day (BID) | ORAL | Status: DC
  Administered 2014-10-06 – 2014-10-08 (×5): 325 mg via ORAL
  Filled 2014-10-06 (×5): qty 1

## 2014-10-06 MED ORDER — SODIUM CHLORIDE 0.9 % IV SOLN
INTRAVENOUS | Status: AC
  Administered 2014-10-06: 1 g via INTRAVENOUS
  Filled 2014-10-06: qty 1000

## 2014-10-06 MED ORDER — MEASLES, MUMPS & RUBELLA VAC ~~LOC~~ INJ
0.5000 mL | INJECTION | Freq: Once | SUBCUTANEOUS | Status: AC
  Administered 2014-10-08: 0.5 mL via SUBCUTANEOUS
  Filled 2014-10-06: qty 0.5

## 2014-10-06 MED ORDER — ONDANSETRON HCL 4 MG/2ML IJ SOLN: 4.0000 mg | INTRAMUSCULAR | Status: DC | PRN

## 2014-10-06 MED ORDER — ZOLPIDEM TARTRATE 5 MG PO TABS: 5.0000 mg | ORAL_TABLET | Freq: Every evening | ORAL | Status: DC | PRN

## 2014-10-06 MED ORDER — DIPHENHYDRAMINE HCL 50 MG/ML IJ SOLN: 12.5000 mg | INTRAMUSCULAR | Status: DC | PRN

## 2014-10-06 MED ORDER — PRENATAL MULTIVITAMIN CH
1.0000 | ORAL_TABLET | Freq: Every day | ORAL | Status: DC
  Administered 2014-10-06 – 2014-10-08 (×3): 1 via ORAL
  Filled 2014-10-06 (×3): qty 1

## 2014-10-06 MED ORDER — OXYCODONE-ACETAMINOPHEN 5-325 MG PO TABS: 1.0000 | ORAL_TABLET | ORAL | Status: DC | PRN

## 2014-10-06 MED ORDER — LACTATED RINGERS IV SOLN: INTRAVENOUS | Status: DC

## 2014-10-06 MED ORDER — DIBUCAINE 1 % RE OINT: 1.0000 "application " | TOPICAL_OINTMENT | RECTAL | Status: DC | PRN

## 2014-10-06 MED ORDER — ACETAMINOPHEN 325 MG PO TABS: 650.0000 mg | ORAL_TABLET | ORAL | Status: DC | PRN

## 2014-10-06 MED ORDER — BENZOCAINE-MENTHOL 20-0.5 % EX AERO: 1.0000 "application " | INHALATION_SPRAY | CUTANEOUS | Status: DC | PRN

## 2014-10-06 MED ORDER — IBUPROFEN 600 MG PO TABS
600.0000 mg | ORAL_TABLET | Freq: Four times a day (QID) | ORAL | Status: DC
  Administered 2014-10-06 – 2014-10-08 (×10): 600 mg via ORAL
  Filled 2014-10-06 (×9): qty 1

## 2014-10-06 MED ORDER — ONDANSETRON HCL 4 MG PO TABS: 4.0000 mg | ORAL_TABLET | ORAL | Status: DC | PRN

## 2014-10-06 MED ORDER — MISOPROSTOL 200 MCG PO TABS
ORAL_TABLET | ORAL | Status: AC
  Filled 2014-10-06: qty 1

## 2014-10-06 MED ORDER — FENTANYL 2.5 MCG/ML W/ROPIVACAINE 0.2% IN NS 100 ML EPIDURAL INFUSION (ARMC-ANES)
8.0000 mL/h | EPIDURAL | Status: DC
  Administered 2014-10-05 (×2): 8 mL/h via EPIDURAL

## 2014-10-06 MED ORDER — PHENYLEPHRINE 40 MCG/ML (10ML) SYRINGE FOR IV PUSH (FOR BLOOD PRESSURE SUPPORT)
80.0000 ug | PREFILLED_SYRINGE | INTRAVENOUS | Status: DC | PRN
  Filled 2014-10-06: qty 2

## 2014-10-06 MED ORDER — METHYLERGONOVINE MALEATE 0.2 MG/ML IJ SOLN
0.2000 mg | INTRAMUSCULAR | Status: DC | PRN
  Filled 2014-10-06: qty 1

## 2014-10-06 MED ORDER — EPHEDRINE 5 MG/ML INJ
10.0000 mg | INTRAVENOUS | Status: DC | PRN
Start: 2014-10-06 — End: 2014-10-06
  Filled 2014-10-06: qty 2

## 2014-10-06 MED ORDER — OXYTOCIN 40 UNITS IN LACTATED RINGERS INFUSION - SIMPLE MED: 62.5000 mL/h | INTRAVENOUS | Status: DC | PRN

## 2014-10-06 MED ORDER — SODIUM CHLORIDE 0.9 % IV SOLN: 1.0000 g | Freq: Once | INTRAVENOUS | Status: DC

## 2014-10-06 MED ORDER — TETANUS-DIPHTH-ACELL PERTUSSIS 5-2.5-18.5 LF-MCG/0.5 IM SUSP: 0.5000 mL | Freq: Once | INTRAMUSCULAR | Status: DC

## 2014-10-06 MED ORDER — DIPHENHYDRAMINE HCL 25 MG PO CAPS: 25.0000 mg | ORAL_CAPSULE | Freq: Four times a day (QID) | ORAL | Status: DC | PRN

## 2014-10-07 LAB — SURGICAL PATHOLOGY

## 2014-10-07 MED ORDER — IBUPROFEN 800 MG PO TABS: 800.0000 mg | ORAL_TABLET | Freq: Three times a day (TID) | ORAL | Status: DC | PRN

## 2014-10-07 MED ORDER — FERROUS SULFATE 325 (65 FE) MG PO TABS: 325.0000 mg | ORAL_TABLET | Freq: Two times a day (BID) | ORAL | Status: DC

## 2014-10-07 NOTE — Progress Notes (Addendum)
Post Partum Day #1 Subjective: no complaints, up ad lib, voiding and tolerating PO  Objective: Blood pressure 121/69, pulse 78, temperature 98.9 F (37.2 C), temperature source Oral, resp. rate 18, height 5\' 3"  (1.6 m), weight 78.019 kg (172 lb), last menstrual period 01/14/2014, SpO2 100 %, unknown if currently breastfeeding.  Physical Exam:  General: alert and no distress Lochia: appropriate Uterine Fundus: firm Incision: none DVT Evaluation: No evidence of DVT seen on physical exam.   Recent Labs  10/05/14 1132  HGB 10.5*  HCT 31.1*    Assessment/Plan: Plan for discharge tomorrow, Breastfeeding and Contraception Nexplanon  Plan for circumcision at pediatrician's office.  Order postpartum Hgb.    LOS: 2 days   Arnel Wymer 10/07/2014, 1:03 PM

## 2014-10-07 NOTE — Discharge Instructions (Signed)

## 2014-10-07 NOTE — Anesthesia Postprocedure Evaluation (Signed)
  Anesthesia Post-op Note  Patient: Debbie Padilla  Procedure(s) Performed: * No procedures listed *  Anesthesia type:Epidural  Patient location: PACU  Post pain: Pain level controlled  Post assessment: Post-op Vital signs reviewed, Patient's Cardiovascular Status Stable, Respiratory Function Stable, Patent Airway and No signs of Nausea or vomiting  Post vital signs: Reviewed and stable  Last Vitals:  Filed Vitals:   10/07/14 0336  BP: 109/59  Pulse: 64  Temp: 36.6 C  Resp: 18    Level of consciousness: awake, alert  and patient cooperative  Complications: No apparent anesthesia complications

## 2014-10-08 LAB — CBC
HCT: 28.8 % — ABNORMAL LOW (ref 35.0–47.0)
HEMOGLOBIN: 9.8 g/dL — AB (ref 12.0–16.0)
MCH: 31.3 pg (ref 26.0–34.0)
MCHC: 33.9 g/dL (ref 32.0–36.0)
MCV: 92.4 fL (ref 80.0–100.0)
PLATELETS: 194 10*3/uL (ref 150–440)
RBC: 3.12 MIL/uL — ABNORMAL LOW (ref 3.80–5.20)
RDW: 12.9 % (ref 11.5–14.5)
WBC: 10.2 10*3/uL (ref 3.6–11.0)

## 2014-10-08 NOTE — Progress Notes (Signed)
Pt discharged home with infant. MMR vaccine given. Discharge instructions reviewed.

## 2014-10-08 NOTE — Discharge Summary (Signed)
Obstetric Discharge Summary Reason for Admission: rupture of membranes Prenatal Procedures: NST Intrapartum Procedures: spontaneous vaginal delivery and uterine exploration Postpartum Procedures: antibiotics Complications-Operative and Postpartum: none HEMOGLOBIN  Date Value Ref Range Status  10/05/2014 10.5* 12.0 - 16.0 g/dL Final   HGB  Date Value Ref Range Status  09/13/2014 11.6* 12.0-16.0 g/dL Final   HCT  Date Value Ref Range Status  10/05/2014 31.1* 35.0 - 47.0 % Final  09/13/2014 33.8* 35.0-47.0 % Final    Physical Exam:  General: alert, cooperative and no distress Lochia: appropriate Uterine Fundus: firm Incision: none DVT Evaluation: No evidence of DVT seen on physical exam.  Discharge Diagnoses: Term Pregnancy-delivered and PROM x24 hours  Discharge Information: Date: 10/08/2014 Activity: pelvic rest x 6 weeks Diet: routine Medications: PNV, Ibuprofen, Colace and Iron Condition: stable Instructions: refer to practice specific booklet Discharge to: home Follow-up Information    Follow up with Hildred LaserHERRY, Wilhemina Grall, MD In 6 weeks.   Specialty:  Obstetrics and Gynecology   Why:  for postpartum check   Contact information:   [address] [place] [id number] [phone]       Newborn Data: Live born female  Birth Weight: 6 lb 2.8 oz (2800 g) APGAR: 8, 9  Home with mother.  Debbie Padilla 10/08/2014, 8:40 AM

## 2014-11-17 ENCOUNTER — Encounter: Payer: Self-pay | Admitting: Obstetrics and Gynecology

## 2015-05-22 NOTE — L&D Delivery Note (Signed)
Delivery Summary for Debbie Padilla  Labor Events:   Preterm labor:   Rupture date:   Rupture time:   Rupture type: Spontaneous  Fluid Color: Clear  Induction:   Augmentation:   Complications:   Cervical ripening:          Delivery:   Episiotomy:   Lacerations:   Repair suture:   Repair # of packets:   Blood loss (ml): 200   Information for the patient's newborn:  Arrie Araninnin, Boy Reniah [[id number]][[id number]]    Delivery 12/31/2015 10:27 AM by  Vaginal, Spontaneous Delivery Sex:  female Gestational Age: 3760w1d Delivery Clinician:  Hildred LaserAnika Dewaun Kinzler, MD Living?: Yes        APGARS  One minute Five minutes Ten minutes  Skin color:        Heart rate:        Grimace:        Muscle tone:        Breathing:        Totals: 7  9      Presentation/position:      Resuscitation:   Cord information:    Disposition of cord blood:     Blood gases sent?  Complications:   Placenta: Delivered: Spontaneous    Appearance: Intact Newborn Measurements: Weight: 6 lb 3.5 oz (2820 g)  Height:    Head circumference:    Chest circumference:    Other providers:    Additional  information: Forceps:   Vacuum:   Breech:   Observed anomalies     Delivery Note Called due to fetal deceleration from baseline, HR in 60s for 2 min.  Patient was approximately 6-7 cm with rapid change.  At 10:27 AM a viable female was delivered via Vaginal, Spontaneous Delivery (Presentation: Vertex; LOA position).  APGAR: 7, 9; weight 6 lb 3.5 oz (2820 g).   Placenta status: spontaneous, intact.  Cord: with the following complications: nuchal cord x 1 (loose), could not reduce prior to delivery of remainder of body.  Cord pH: Not obtained.   Delayed cord clamping not performed.   Anesthesia: None Episiotomy: None Lacerations: None Suture Repair: None Est. Blood Loss (mL): 200  Mom to postpartum.  Baby to Couplet care / Skin to Skin.  Hildred Lasernika Cordarro Spinnato 12/31/2015, 11:15 AM

## 2015-09-21 ENCOUNTER — Ambulatory Visit (INDEPENDENT_AMBULATORY_CARE_PROVIDER_SITE_OTHER): Payer: Self-pay | Admitting: Obstetrics and Gynecology

## 2015-09-21 ENCOUNTER — Encounter: Payer: Self-pay | Admitting: Obstetrics and Gynecology

## 2015-09-21 VITALS — BP 128/77 | HR 108 | Ht 62.5 in | Wt 147.9 lb

## 2015-09-21 DIAGNOSIS — Z3490 Encounter for supervision of normal pregnancy, unspecified, unspecified trimester: Secondary | ICD-10-CM

## 2015-09-21 DIAGNOSIS — N926 Irregular menstruation, unspecified: Secondary | ICD-10-CM

## 2015-09-21 DIAGNOSIS — Z331 Pregnant state, incidental: Secondary | ICD-10-CM

## 2015-09-21 DIAGNOSIS — Z8659 Personal history of other mental and behavioral disorders: Secondary | ICD-10-CM

## 2015-09-21 DIAGNOSIS — N912 Amenorrhea, unspecified: Secondary | ICD-10-CM

## 2015-09-21 DIAGNOSIS — O09292 Supervision of pregnancy with other poor reproductive or obstetric history, second trimester: Secondary | ICD-10-CM

## 2015-09-21 DIAGNOSIS — Z3492 Encounter for supervision of normal pregnancy, unspecified, second trimester: Secondary | ICD-10-CM

## 2015-09-21 DIAGNOSIS — Z720 Tobacco use: Secondary | ICD-10-CM

## 2015-09-21 LAB — POCT URINE PREGNANCY: PREG TEST UR: POSITIVE — AB

## 2015-09-21 MED ORDER — PROMETHAZINE HCL 25 MG PO TABS: 25.0000 mg | ORAL_TABLET | Freq: Four times a day (QID) | ORAL | Status: DC | PRN

## 2015-09-21 MED ORDER — ASPIRIN EC 81 MG PO TBEC: 81.0000 mg | DELAYED_RELEASE_TABLET | Freq: Every day | ORAL | Status: DC

## 2015-09-21 NOTE — Progress Notes (Signed)
GYN ENCOUNTER NOTE  Subjective:       Debbie Padilla is a 30 y.o. [phone] female is here for gynecologic evaluation of the following issues:  1.Amenorrhea and Confirmation of pregnancy. Pt believes she is around [redacted] weeks pregnant. LMP was 05/04/2015. She has had spotting monthly around when her cycles would be expected. Patient also reports n/v that has improved over the past week, cramping that occurs 2-3x/week, and sharp pains that began 4 weeks ago and are occuring 1-2x/week. Patient has also had some HA with 2 migraines, and transient loss of vision. Began to feel fetal movement about 1 week ago. Father of this child is same as her last child, but different from first two children. This was not a planned pregnancy, patient has not been taking prenatal vitamins. Patient has 3 other children currently all delivered by SVD. 2nd child was preterm at 35 wks, but there were no complications of pregnancy and the child was born healthy. With her last child she had bleeding and HTN about 1 month prior to delivery and was given Magnesium sulfate with the diagnosis of preeclampsia. She also reports marginal placenta previa with last pregnancy.   Gynecologic History Patient's last menstrual period was 05/04/2015 (approximate).  Contraception: currently pregnant  Obstetric History OB History  Gravida Para Term Preterm AB SAB TAB Ectopic Multiple Living  0 3    # Outcome Date GA Lbr Len/2nd Weight Sex Delivery Anes PTL Lv  7 Current           6 Term 10/06/14 [redacted]w[redacted]d 06:50 / 00:22 6 lb 2.8 oz (2.8 kg) M Vag-Spont EPI  Y  5 SAB 08/04/13          4 SAB 11/04/08          3 Preterm 09/06/08 [redacted]w[redacted]d   F Vag-Spont EPI  Y  2 Term 02/16/04 [redacted]w[redacted]d   F Vag-Spont EPI  Y  1 TAB 11/03/02              Past Medical History  Diagnosis Date  . Preterm labor   . Anxiety     Past Surgical History  Procedure Laterality Date  . Ankle incision and drainage Left July 2012  . Dilation and  curettage of uterus      No current outpatient prescriptions on file prior to visit.   No current facility-administered medications on file prior to visit.    No Known Allergies  Social History   Social History  . Marital Status: Single    Spouse Name: N/A  . Number of Children: N/A  . Years of Education: N/A   Occupational History  . Not on file.   Social History Main Topics  . Smoking status: Current Every Day Smoker -- 0.25 packs/day    Types: Cigarettes  . Smokeless tobacco: Never Used  . Alcohol Use: No  . Drug Use: No  . Sexual Activity: Yes    Birth Control/ Protection: None   Other Topics Concern  . Not on file   Social History Narrative    Family History  Problem Relation Age of Onset  . Asthma Daughter   . Asthma Paternal Grandmother   . Diabetes Paternal Grandmother   . Cancer Neg Hx   . Heart disease Neg Hx     The following portions of the patient's history were reviewed and updated as appropriate: allergies, current medications, past family history, past medical history, past social history,  past surgical history and problem list.  Review of Systems Review of Systems - Negative except as mentioned in HPI Review of Systems - General ROS: negative for - chills, fatigue, fever, hot flashes, malaise or night sweats Hematological and Lymphatic ROS: negative for - bleeding problems or swollen lymph nodes Gastrointestinal ROS: negative for -blood in stools, change in bowel habits Musculoskeletal ROS: negative for - joint pain, muscle pain or muscular weakness Genito-Urinary ROS: negative for - dyspareunia, dysuria, genital discharge, genital ulcers, hematuria, incontinence, nocturia or pelvic pain  Objective:   BP 128/77 mmHg  Pulse 108  Ht 5' 2.5" (1.588 m)  Wt 147 lb 14.4 oz (67.087 kg)  BMI 26.60 kg/m2  LMP 05/04/2015 (Approximate)  Breastfeeding? No CONSTITUTIONAL: Well-developed, pregnant female in no acute distress.  HENT:  Normocephalic,  atraumatic.  NECK: Normal range of motion, supple, no masses.  Normal thyroid.  SKIN: Skin is warm and dry. No rash noted. Not diaphoretic. No erythema. No pallor. NEUROLGIC: Alert and oriented to person, place, and time. PSYCHIATRIC: Normal mood and affect. Normal behavior. Normal judgment and thought content. CARDIOVASCULAR:Not Examined RESPIRATORY: Not Examined BREASTS: Not Examined ABDOMEN: fundal height 22 cm (U+3cm); fetal movement palpated PELVIC: Not Examined. MUSCULOSKELETAL: Normal range of motion. No tenderness.  No cyanosis, clubbing, or edema.  Fetal HR 135 on Doppler US   Assessment:   1. Amenorrhea; pregnancy, second trimester, approximately 23 weeks - POCT urine pregnancy - US OB Comp + 14 Wk; Future  2. Irregular menses - US OB Comp + 14 Wk; Future  3. Hx of preeclampsia, prior pregnancy, currently pregnant, second trimester, same father of baby  4. Tobacco user -4-5 cigarettes daily  5. History of bipolar, not currently on meds  6. Late entry to care, single.     Plan:   1. Begin prenatal vitamins. 2. Begin 81 mg baby aspirin daily for preeclampsia prophylaxis. 3. Prenatal labs to be drawn as soon as patient is able. 4. US ordered for confirmation and anatomy.  5. Discussed importance of decreasing tobacco use and cessation. 6. Follow up with Dr. Valentino Saxonherry in 2 wks.  A total of 25 minutes were spent face-to-face with the patient during this encounter and over half of that time involved counseling and coordination of care.   Tresa EndoKelly Rayburn PA-S Herold HarmsMartin A Defrancesco, MD   I have seen, interviewed, and examined the patient in conjunction with the [employer] and affirm the diagnosis and management plan. Martin A. DeFrancesco, MD, FACOG   Note: This dictation was prepared with Dragon dictation along with smaller phrase technology. Any transcriptional errors that result from this process are unintentional.

## 2015-09-22 LAB — GC/CHLAMYDIA PROBE AMP
Chlamydia trachomatis, NAA: NEGATIVE
Neisseria gonorrhoeae by PCR: NEGATIVE

## 2015-09-22 NOTE — Patient Instructions (Signed)
1. Begin prenatal vitamins. 2. Begin 81 mg baby aspirin daily for preeclampsia prophylaxis. 3. Prenatal labs to be drawn as soon as patient is able. 4. US ordered for confirmation and anatomy.  5. Discussed importance of decreasing tobacco use and cessation. 6. Follow up with Dr. Valentino Saxonherry in 2 wk-new OB

## 2015-09-24 LAB — CULTURE, OB URINE

## 2015-09-24 LAB — URINE CULTURE, OB REFLEX

## 2015-09-26 ENCOUNTER — Other Ambulatory Visit: Payer: Medicaid Other

## 2015-09-26 LAB — MICROSCOPIC EXAMINATION
Casts: NONE SEEN /lpf
Epithelial Cells (non renal): >10 /hpf — AB (ref 0–10)

## 2015-09-26 LAB — URINALYSIS, ROUTINE W REFLEX MICROSCOPIC
BILIRUBIN UA: NEGATIVE
GLUCOSE, UA: NEGATIVE
KETONES UA: NEGATIVE
NITRITE UA: NEGATIVE
Protein, UA: NEGATIVE
RBC UA: NEGATIVE
SPEC GRAV UA: 1.022 (ref 1.005–1.030)
Urobilinogen, Ur: 1 mg/dL (ref 0.2–1.0)
pH, UA: 7.5 (ref 5.0–7.5)

## 2015-09-26 LAB — NICOTINE SCREEN, URINE: Cotinine Ql Scrn, Ur: POSITIVE ng/mL

## 2015-09-26 LAB — CANNABINOID (GC/MS), URINE: Cannabinoid: POSITIVE — AB

## 2015-09-26 LAB — MONITOR DRUG PROFILE 14(MW)
AMPHETAMINE SCREEN URINE: NEGATIVE ng/mL
BARBITURATE SCREEN URINE: NEGATIVE ng/mL
BENZODIAZEPINE SCREEN, URINE: NEGATIVE ng/mL
Buprenorphine, Urine: NEGATIVE ng/mL
Cocaine (Metab) Scrn, Ur: NEGATIVE ng/mL
Creatinine(Crt), U: 152.2 mg/dL (ref 20.0–300.0)
FENTANYL, URINE: NEGATIVE pg/mL
Meperidine Screen, Urine: NEGATIVE ng/mL
Methadone Screen, Urine: NEGATIVE ng/mL
OXYCODONE+OXYMORPHONE UR QL SCN: NEGATIVE ng/mL
Opiate Scrn, Ur: NEGATIVE ng/mL
PHENCYCLIDINE QUANTITATIVE URINE: NEGATIVE ng/mL
Ph of Urine: 7.5 (ref 4.5–8.9)
Propoxyphene Scrn, Ur: NEGATIVE ng/mL
SPECIFIC GRAVITY: 1.023
TRAMADOL SCREEN, URINE: NEGATIVE ng/mL

## 2015-09-27 ENCOUNTER — Encounter: Payer: Self-pay | Admitting: Obstetrics and Gynecology

## 2015-09-27 DIAGNOSIS — R892 Abnormal level of other drugs, medicaments and biological substances in specimens from other organs, systems and tissues: Secondary | ICD-10-CM | POA: Insufficient documentation

## 2015-09-27 LAB — CBC WITH DIFFERENTIAL/PLATELET
BASOS: 0 %
Basophils Absolute: 0 10*3/uL (ref 0.0–0.2)
EOS (ABSOLUTE): 0.2 10*3/uL (ref 0.0–0.4)
EOS: 2 %
HEMATOCRIT: 33.3 % — AB (ref 34.0–46.6)
Hemoglobin: 11.1 g/dL (ref 11.1–15.9)
IMMATURE GRANS (ABS): 0 10*3/uL (ref 0.0–0.1)
IMMATURE GRANULOCYTES: 0 %
LYMPHS: 16 %
Lymphocytes Absolute: 1.7 10*3/uL (ref 0.7–3.1)
MCH: 28.8 pg (ref 26.6–33.0)
MCHC: 33.3 g/dL (ref 31.5–35.7)
MCV: 86 fL (ref 79–97)
MONOS ABS: 0.5 10*3/uL (ref 0.1–0.9)
Monocytes: 5 %
NEUTROS PCT: 77 %
Neutrophils Absolute: 8.4 10*3/uL — ABNORMAL HIGH (ref 1.4–7.0)
PLATELETS: 183 10*3/uL (ref 150–379)
RBC: 3.86 x10E6/uL (ref 3.77–5.28)
RDW: 15.2 % (ref 12.3–15.4)
WBC: 10.9 10*3/uL — AB (ref 3.4–10.8)

## 2015-09-27 LAB — HEPATITIS B SURFACE ANTIGEN: HEP B S AG: NEGATIVE

## 2015-09-27 LAB — RPR: RPR: NONREACTIVE

## 2015-09-27 LAB — ABO AND RH: Rh Factor: POSITIVE

## 2015-09-27 LAB — ANTIBODY SCREEN: Antibody Screen: NEGATIVE

## 2015-09-27 LAB — HIV ANTIBODY (ROUTINE TESTING W REFLEX): HIV SCREEN 4TH GENERATION: NONREACTIVE

## 2015-09-27 LAB — SICKLE CELL SCREEN: SICKLE CELL SCREEN: NEGATIVE

## 2015-09-27 LAB — VARICELLA ZOSTER ANTIBODY, IGG: Varicella zoster IgG: 1352 index (ref >165)

## 2015-09-27 LAB — RUBELLA SCREEN: RUBELLA: 1.31 {index} (ref >0.99)

## 2015-11-03 ENCOUNTER — Encounter: Payer: Medicaid Other | Admitting: Obstetrics and Gynecology

## 2015-11-04 ENCOUNTER — Ambulatory Visit (INDEPENDENT_AMBULATORY_CARE_PROVIDER_SITE_OTHER): Payer: Medicaid Other

## 2015-11-04 DIAGNOSIS — Z3492 Encounter for supervision of normal pregnancy, unspecified, second trimester: Secondary | ICD-10-CM | POA: Diagnosis not present

## 2015-11-04 DIAGNOSIS — N912 Amenorrhea, unspecified: Secondary | ICD-10-CM | POA: Diagnosis not present

## 2015-11-04 DIAGNOSIS — N926 Irregular menstruation, unspecified: Secondary | ICD-10-CM

## 2015-11-10 ENCOUNTER — Encounter: Payer: Self-pay | Admitting: Obstetrics and Gynecology

## 2015-11-10 ENCOUNTER — Ambulatory Visit (INDEPENDENT_AMBULATORY_CARE_PROVIDER_SITE_OTHER): Payer: Medicaid Other | Admitting: Obstetrics and Gynecology

## 2015-11-10 ENCOUNTER — Other Ambulatory Visit: Payer: Medicaid Other

## 2015-11-10 VITALS — BP 139/78 | HR 86 | Wt 157.2 lb

## 2015-11-10 DIAGNOSIS — O09293 Supervision of pregnancy with other poor reproductive or obstetric history, third trimester: Secondary | ICD-10-CM

## 2015-11-10 DIAGNOSIS — Z131 Encounter for screening for diabetes mellitus: Secondary | ICD-10-CM

## 2015-11-10 DIAGNOSIS — O09893 Supervision of other high risk pregnancies, third trimester: Secondary | ICD-10-CM

## 2015-11-10 DIAGNOSIS — Z3493 Encounter for supervision of normal pregnancy, unspecified, third trimester: Secondary | ICD-10-CM

## 2015-11-10 DIAGNOSIS — O09899 Supervision of other high risk pregnancies, unspecified trimester: Secondary | ICD-10-CM | POA: Insufficient documentation

## 2015-11-10 DIAGNOSIS — O0930 Supervision of pregnancy with insufficient antenatal care, unspecified trimester: Secondary | ICD-10-CM | POA: Insufficient documentation

## 2015-11-10 DIAGNOSIS — O0933 Supervision of pregnancy with insufficient antenatal care, third trimester: Secondary | ICD-10-CM

## 2015-11-10 DIAGNOSIS — R892 Abnormal level of other drugs, medicaments and biological substances in specimens from other organs, systems and tissues: Secondary | ICD-10-CM

## 2015-11-10 DIAGNOSIS — O09219 Supervision of pregnancy with history of pre-term labor, unspecified trimester: Secondary | ICD-10-CM

## 2015-11-10 DIAGNOSIS — O09299 Supervision of pregnancy with other poor reproductive or obstetric history, unspecified trimester: Secondary | ICD-10-CM | POA: Insufficient documentation

## 2015-11-10 DIAGNOSIS — O09213 Supervision of pregnancy with history of pre-term labor, third trimester: Secondary | ICD-10-CM

## 2015-11-10 DIAGNOSIS — Z720 Tobacco use: Secondary | ICD-10-CM

## 2015-11-10 LAB — POCT URINALYSIS DIPSTICK
BILIRUBIN UA: NEGATIVE
Blood, UA: NEGATIVE
GLUCOSE UA: NEGATIVE
Ketones, UA: NEGATIVE
LEUKOCYTES UA: NEGATIVE
NITRITE UA: NEGATIVE
PH UA: 7.5
Protein, UA: NEGATIVE
Spec Grav, UA: 1.01
Urobilinogen, UA: NEGATIVE

## 2015-11-10 NOTE — Progress Notes (Deleted)
OBSTETRIC INITIAL PRENATAL VISIT  Subjective:    Debbie Padilla is being seen today for her first obstetrical visit.  This is not a planned pregnancy. She is a [id number] female at 1720w6d gestation, Estimated Date of Delivery: 01/06/16 with Patient's last menstrual period was 05/04/2015 (approximate). (consistent with *** week sono). Her obstetrical history is significant for smoker and placenta previa. Relationship with FOB: Significant other; living together. Patient is uncertain about intention to breast feed. Pregnancy history fully reviewed.    Obstetric History   G7   P3   T2   P1   A3   TAB1   SAB2   E0   M0   L3     # Outcome Date GA Lbr Len/2nd Weight Sex Delivery Anes PTL Lv  7 Current           6 Term 10/06/14 2342w6d 06:50 / 00:22 6 lb 2.8 oz (2.8 kg) M Vag-Spont EPI  Y     Apgar1:  8                Apgar5: 9  5 SAB 08/04/13          4 SAB 11/04/08          3 Preterm 09/06/08 6782w4d   F Vag-Spont EPI  Y  2 Term 02/16/04 6975w0d   F Vag-Spont EPI  Y  1 TAB 11/03/02              Gynecologic History:  Last pap smear was after 09/2016.  Results were normal.  Denies h/o abnormal pap smears in the past.  Denies history of STIs.    Past Medical History  Diagnosis Date  . Preterm labor   . Anxiety      Family History  Problem Relation Age of Onset  . Asthma Daughter   . Asthma Paternal Grandmother   . Diabetes Paternal Grandmother   . Cancer Neg Hx   . Heart disease Neg Hx      Past Surgical History  Procedure Laterality Date  . Ankle incision and drainage Left July 2012  . Dilation and curettage of uterus       Social History   Social History  . Marital Status: Single    Spouse Name: N/A  . Number of Children: N/A  . Years of Education: N/A   Occupational History  . Not on file.   Social History Main Topics  . Smoking status: Current Every Day Smoker -- 0.25 packs/day    Types: Cigarettes  . Smokeless tobacco: Never Used  . Alcohol Use: No  .  Drug Use: No  . Sexual Activity: Yes    Birth Control/ Protection: None   Other Topics Concern  . Not on file   Social History Narrative  works at [employer]   Current Outpatient Prescriptions on File Prior to Visit  Medication Sig Dispense Refill  . promethazine (PHENERGAN) 25 MG tablet Take 1 tablet (25 mg total) by mouth every 6 (six) hours as needed for nausea or vomiting. 30 tablet 1  . aspirin EC 81 MG tablet Take 1 tablet (81 mg total) by mouth daily. (Patient not taking: Reported on 11/10/2015) 30 tablet 6   No current facility-administered medications on file prior to visit.     No Known Allergies    Review of Systems General:Not Present- Fever, Weight Loss and Weight Gain. Skin:Not Present- Rash. HEENT:Not Present- Blurred Vision, Headache and Bleeding Gums. Respiratory:Not Present-  Difficulty Breathing. Breast:Not Present- Breast Mass. Cardiovascular:Not Present- Chest Pain, Elevated Blood Pressure, Fainting / Blacking Out and Shortness of Breath. Gastrointestinal:Not Present- Abdominal Pain, Constipation, Nausea and Vomiting. Female Genitourinary:Not Present- Frequency, Painful Urination, Pelvic Pain, Vaginal Bleeding, Vaginal Discharge, Contractions, regular, Fetal Movements Decreased, Urinary Complaints and Vaginal Fluid. Musculoskeletal:Back Pain present; no Leg Cramps. Neurological:Not Present- Dizziness. Psychiatric:Not Present- Depression.     Objective:   Blood pressure 139/78, pulse 86, weight 157 lb 3.2 oz (71.305 kg), last menstrual period 05/04/2015, not currently breastfeeding.  Body mass index is 28.28 kg/(m^2).   General Appearance:    Alert, cooperative, no distress, appears stated age  Head:    Normocephalic, without obvious abnormality, atraumatic  Eyes:    PERRL, conjunctiva/corneas clear, EOM's intact, both eyes  Ears:    Normal external ear canals, both ears  Nose:   Nares normal, septum midline, mucosa normal, no  drainage or sinus tenderness  Throat:   Lips, mucosa, and tongue normal; teeth and gums normal  Neck:   Supple, symmetrical, trachea midline, no adenopathy; thyroid: no enlargement/tenderness/nodules; no carotid bruit or JVD  Back:     Symmetric, no curvature, ROM normal, no CVA tenderness  Lungs:     Clear to auscultation bilaterally, respirations unlabored  Chest Wall:    No tenderness or deformity   Heart:    Regular rate and rhythm, S1 and S2 normal, no murmur, rub or gallop  Breast Exam:    No tenderness, masses, or nipple abnormality  Abdomen:     Soft, non-tender, bowel sounds active all four quadrants, no masses, no organomegaly.  FH ***.  FHT ***  bpm.  Genitalia:    Pelvic:external genitalia normal, vagina without lesions, discharge, or tenderness, rectovaginal septum  normal. Cervix normal in appearance, no cervical motion tenderness, no adnexal masses or tenderness.  Pregnancy positive findings: uterine enlargement: *** wk size, nontender.   Rectal:    Normal external sphincter.  No hemorrhoids appreciated. Internal exam not done.   Extremities:   Extremities normal, atraumatic, no cyanosis or edema  Pulses:   2+ and symmetric all extremities  Skin:   Skin color, texture, turgor normal, no rashes or lesions  Lymph nodes:   Cervical, supraclavicular, and axillary nodes normal  Neurologic:   CNII-XII intact, normal strength, sensation and reflexes throughout       Assessment:    Pregnancy at 31 and 6/7 weeks      Plan:    Initial labs reviewed. Prenatal vitamins encouraged.  Encourage smoking cessation. RTC in 4 weeks. Problem list reviewed and updated. New OB counseling:  The patient has been given an overview regarding routine prenatal care.  Recommendations regarding diet, weight gain, and exercise in pregnancy were given. Prenatal testing, optional genetic testing, and ultrasound use in pregnancy were reviewed.  AFP3 discussed: declined. Benefits of Breast Feeding were  discussed. The patient is encouraged to consider nursing her baby post partum. Follow up in 4 weeks.  ***% of *** min visit spent on counseling and coordination of care.

## 2015-11-10 NOTE — Progress Notes (Signed)
OBSTETRIC INITIAL PRENATAL VISIT  Subjective:    Debbie Padilla is being seen today for her first obstetrical visit.  This is not a planned pregnancy. She is a [id number] female at [redacted]w[redacted]d gestation, Estimated Date of Delivery: 01/06/16 by 31 week sono, not consistent with patient's last menstrual period of 05/04/2015.  Her obstetrical history is significant for h/o bipolar disorder (on no meds), smoker and late entry to prenatal care, short interval between pregnancies, positive marijuana use, h/o preterm delivery x 1. Relationship with FOB: significant other, not living together. Patient does not intend to breast feed. Pregnancy history fully reviewed.    Obstetric History   G7   P3   T2   P1   A3   TAB1   SAB2   E0   M0   L3     # Outcome Date GA Lbr Len/2nd Weight Sex Delivery Anes PTL Lv  7 Current           6 Term 10/06/14 [redacted]w[redacted]d 06:50 / 00:22 6 lb 2.8 oz (2.8 kg) M Vag-Spont EPI  Y     Apgar1:  8                Apgar5: 9  5 SAB 08/04/13          4 SAB 11/04/08          3 Preterm 09/06/08 [redacted]w[redacted]d   F Vag-Spont EPI  Y  2 Term 02/16/04 [redacted]w[redacted]d   F Vag-Spont EPI  Y  1 TAB 11/03/02              Gynecologic History:  Last pap smear was 12/2013.  Results were normal.  Denies h/o abnormal pap smaers in the past.  Denies history of STIs.    Past Medical History  Diagnosis Date  . Preterm labor   . Anxiety   . Bipolar disorder (HCC)     Family History  Problem Relation Age of Onset  . Asthma Daughter   . Asthma Paternal Grandmother   . Diabetes Paternal Grandmother   . Cancer Neg Hx   . Heart disease Neg Hx     Past Surgical History  Procedure Laterality Date  . Ankle incision and drainage Left July 2012  . Dilation and curettage of uterus      Social History   Social History  . Marital Status: Single    Spouse Name: N/A  . Number of Children: N/A  . Years of Education: N/A   Occupational History  . Not on file.   Social History Main Topics  . Smoking  status: Current Every Day Smoker -- 0.25 packs/day    Types: Cigarettes  . Smokeless tobacco: Never Used  . Alcohol Use: No  . Drug Use: No  . Sexual Activity: Yes    Birth Control/ Protection: None   Other Topics Concern  . Not on file   Social History Narrative    Current Outpatient Prescriptions on File Prior to Visit  Medication Sig Dispense Refill  . promethazine (PHENERGAN) 25 MG tablet Take 1 tablet (25 mg total) by mouth every 6 (six) hours as needed for nausea or vomiting. 30 tablet 1  . aspirin EC 81 MG tablet Take 1 tablet (81 mg total) by mouth daily. (Patient not taking: Reported on 11/10/2015) 30 tablet 6   No current facility-administered medications on file prior to visit.    No Known Allergies    Review of Systems General:Not Present- Fever, Weight Loss  and Weight Gain. Skin:Not Present- Rash. HEENT:Not Present- Blurred Vision, Headache and Bleeding Gums. Respiratory:Not Present- Difficulty Breathing. Breast:Not Present- Breast Mass. Cardiovascular:Not Present- Chest Pain, Elevated Blood Pressure, Fainting / Blacking Out and Shortness of Breath. Gastrointestinal:Present - Nausea (improving). Not Present- Abdominal Pain, Constipation, and Vomiting. Female Genitourinary: Not Present- Frequency, Painful Urination, Pelvic Pain, Vaginal Bleeding, Vaginal Discharge, Contractions, regular, Fetal Movements Decreased, Urinary Complaints and Vaginal Fluid. Musculoskeletal:Not Present- Back Pain and Leg Cramps. Neurological:Not Present- Dizziness. Psychiatric:Not Present- Depression.     Objective:   Blood pressure 139/78, pulse 86, weight 157 lb 3.2 oz (71.305 kg), last menstrual period 05/04/2015, not currently breastfeeding.  Body mass index is 28.28 kg/(m^2).  General Appearance:    Alert, cooperative, no distress, appears stated age  Head:    Normocephalic, without obvious abnormality, atraumatic  Eyes:    PERRL, conjunctiva/corneas clear, EOM's  intact, both eyes  Ears:    Normal external ear canals, both ears  Nose:   Nares normal, septum midline, mucosa normal, no drainage or sinus tenderness  Throat:   Lips, mucosa, and tongue normal; teeth and gums normal  Neck:   Supple, symmetrical, trachea midline, no adenopathy; thyroid: no enlargement/tenderness/nodules; no carotid bruit or JVD  Back:     Symmetric, no curvature, ROM normal, no CVA tenderness  Lungs:     Clear to auscultation bilaterally, respirations unlabored  Chest Wall:    No tenderness or deformity   Heart:    Regular rate and rhythm, S1 and S2 normal, no murmur, rub or gallop  Breast Exam:    No tenderness, masses, or nipple abnormality  Abdomen:     Soft, non-tender, bowel sounds active all four quadrants, no masses, no organomegaly.  FH 31.  FHT 146 bpm.  Genitalia:    Pelvic:external genitalia normal, vagina without lesions, or tenderness, rectovaginal septum  Normal. Small amount of white discharge.  Cervix normal in appearance, no cervical motion tenderness, no adnexal masses or tenderness.  Pregnancy positive findings: uterine enlargement: 31 wk size, nontender.   Rectal:    Normal external sphincter.  No hemorrhoids appreciated. Internal exam not done.   Extremities:   Extremities normal, atraumatic, no cyanosis or edema  Pulses:   2+ and symmetric all extremities  Skin:   Skin color, texture, turgor normal, no rashes or lesions  Lymph nodes:   Cervical, supraclavicular, and axillary nodes normal  Neurologic:   CNII-XII intact, normal strength, sensation and reflexes throughout      Assessment:    Pregnancy at 31 and 6/7 weeks    Patient Active Problem List   Diagnosis Date Noted  . H/O premature rupture of membranes in previous pregnancy, currently pregnant 11/10/2015  . H/O preterm delivery, currently pregnant 11/10/2015  . Prenatal care insufficient 11/10/2015  . Short interval between pregnancies affecting pregnancy in third trimester, antepartum  11/10/2015  . Abnormal drug screen 09/27/2015  . Tobacco abuse 10/06/2014     Plan:   Initial labs reviewed.   Prenatal vitamins encouraged. Problem list reviewed and updated. New OB counseling:  The patient has been given an overview regarding routine prenatal care.  Recommendations regarding diet, weight gain, and exercise in pregnancy were given. Prenatal testing, optional genetic testing, and ultrasound use in pregnancy were reviewed.  Out of range by dates for any testing other than cell-free DNA:  declined. Counseled on tobacco smoking and marijuana cessation.  Note that she has stopped marijuana use (was using to help increase appetite and decrease nausea).  H/o preterm delivery x 1, out of range by dates for initiation of 17-OHP injections.  Is completing glucola today.  Benefits of Breast Feeding were discussed. The patient is encouraged to consider nursing her baby post partum. Follow up in 2 weeks.  50% of 30 min visit spent on counseling and coordination of care.

## 2015-11-12 LAB — GLUCOSE TOLERANCE, 1 HOUR: Glucose, 1Hr PP: 45 mg/dL — ABNORMAL LOW (ref 65–199)

## 2015-11-16 ENCOUNTER — Telehealth: Payer: Self-pay

## 2015-11-16 NOTE — Telephone Encounter (Signed)
Called pt, phone states that she is unavailable. Will call again

## 2015-11-16 NOTE — Telephone Encounter (Signed)
-----   Message from Hildred LaserAnika Cherry, MD sent at 11/15/2015  6:55 PM EDT ----- Incorrect test ordered, however values still negative for GDM.  Can inform patient.

## 2015-11-17 NOTE — Telephone Encounter (Signed)
Called pt, no answer, phone states pt is unavavilable

## 2015-11-18 NOTE — Telephone Encounter (Signed)
Letter mailed informing pt that labs were normal.

## 2015-11-24 ENCOUNTER — Encounter: Payer: Medicaid Other | Admitting: Obstetrics and Gynecology

## 2015-12-07 ENCOUNTER — Encounter: Payer: Self-pay | Admitting: Obstetrics and Gynecology

## 2015-12-07 ENCOUNTER — Ambulatory Visit (INDEPENDENT_AMBULATORY_CARE_PROVIDER_SITE_OTHER): Payer: Medicaid Other | Admitting: Obstetrics and Gynecology

## 2015-12-07 VITALS — BP 120/69 | HR 72 | Wt 156.9 lb

## 2015-12-07 DIAGNOSIS — Z3493 Encounter for supervision of normal pregnancy, unspecified, third trimester: Secondary | ICD-10-CM

## 2015-12-07 DIAGNOSIS — O09213 Supervision of pregnancy with history of pre-term labor, third trimester: Secondary | ICD-10-CM

## 2015-12-07 DIAGNOSIS — Z3685 Encounter for antenatal screening for Streptococcus B: Secondary | ICD-10-CM

## 2015-12-07 DIAGNOSIS — Z36 Encounter for antenatal screening of mother: Secondary | ICD-10-CM

## 2015-12-07 DIAGNOSIS — Z3483 Encounter for supervision of other normal pregnancy, third trimester: Secondary | ICD-10-CM

## 2015-12-07 DIAGNOSIS — F3130 Bipolar disorder, current episode depressed, mild or moderate severity, unspecified: Secondary | ICD-10-CM

## 2015-12-07 DIAGNOSIS — F319 Bipolar disorder, unspecified: Secondary | ICD-10-CM | POA: Insufficient documentation

## 2015-12-07 DIAGNOSIS — O0933 Supervision of pregnancy with insufficient antenatal care, third trimester: Secondary | ICD-10-CM

## 2015-12-07 DIAGNOSIS — Z113 Encounter for screening for infections with a predominantly sexual mode of transmission: Secondary | ICD-10-CM

## 2015-12-07 DIAGNOSIS — O09893 Supervision of other high risk pregnancies, third trimester: Secondary | ICD-10-CM

## 2015-12-07 LAB — POCT URINALYSIS DIPSTICK
Bilirubin, UA: NEGATIVE
GLUCOSE UA: NEGATIVE
Ketones, UA: NEGATIVE
Nitrite, UA: NEGATIVE
PH UA: 8.5
PROTEIN UA: NEGATIVE
Spec Grav, UA: 1.01
UROBILINOGEN UA: 1

## 2015-12-07 NOTE — Progress Notes (Signed)
ROB: Patient doing ok.  Notes that [facility].  Notes that patient's is exhibiting depressive symptoms.  Patient reports that FOB was recently incarcerated. Also is going to have to find a new place to live as she is currently living with FOB's mother, who now wants patient to leave. Denies SI/HI. Will have Social Worker involved. 36 week labs done today.  PTL precautions given. Discussed psychotherapy, patient has counseling session scheduled for next week.  May likely hold on initiation of meds until postpartum as patient in late 3rd trimester and has h/o of late preterm/early term deliveries. RTC in 1 week.

## 2015-12-09 LAB — GC/CHLAMYDIA PROBE AMP
Chlamydia trachomatis, NAA: NEGATIVE
Neisseria gonorrhoeae by PCR: NEGATIVE

## 2015-12-11 LAB — CULTURE, BETA STREP (GROUP B ONLY): STREP GP B CULTURE: POSITIVE — AB

## 2015-12-14 ENCOUNTER — Encounter: Payer: Medicaid Other | Admitting: Obstetrics and Gynecology

## 2015-12-27 ENCOUNTER — Ambulatory Visit (INDEPENDENT_AMBULATORY_CARE_PROVIDER_SITE_OTHER): Payer: Medicaid Other | Admitting: Obstetrics and Gynecology

## 2015-12-27 ENCOUNTER — Encounter: Payer: Self-pay | Admitting: Obstetrics and Gynecology

## 2015-12-27 VITALS — BP 124/75 | HR 76 | Wt 162.7 lb

## 2015-12-27 DIAGNOSIS — O0933 Supervision of pregnancy with insufficient antenatal care, third trimester: Secondary | ICD-10-CM

## 2015-12-27 DIAGNOSIS — Z3483 Encounter for supervision of other normal pregnancy, third trimester: Secondary | ICD-10-CM

## 2015-12-27 DIAGNOSIS — Z720 Tobacco use: Secondary | ICD-10-CM

## 2015-12-27 DIAGNOSIS — O09893 Supervision of other high risk pregnancies, third trimester: Secondary | ICD-10-CM

## 2015-12-27 DIAGNOSIS — Z3493 Encounter for supervision of normal pregnancy, unspecified, third trimester: Secondary | ICD-10-CM

## 2015-12-27 DIAGNOSIS — B951 Streptococcus, group B, as the cause of diseases classified elsewhere: Secondary | ICD-10-CM | POA: Insufficient documentation

## 2015-12-27 DIAGNOSIS — R892 Abnormal level of other drugs, medicaments and biological substances in specimens from other organs, systems and tissues: Secondary | ICD-10-CM

## 2015-12-27 LAB — POCT URINALYSIS DIPSTICK
Bilirubin, UA: NEGATIVE
Glucose, UA: NEGATIVE
KETONES UA: NEGATIVE
Nitrite, UA: NEGATIVE
PROTEIN UA: NEGATIVE
RBC UA: NEGATIVE
Urobilinogen, UA: 0.2
pH, UA: 8

## 2015-12-27 NOTE — Progress Notes (Signed)
ROB: [facility] on [facility] (for smoking and depressive symptoms).  Is working on a place to stay after baby is born (Has applied for Section 8 housing).  GBS+, will need antibiotics in labor.  Labor precautions given. RTC in 1 week.

## 2015-12-31 ENCOUNTER — Inpatient Hospital Stay
Admission: EM | Admit: 2015-12-31 | Discharge: 2016-01-02 | DRG: 767 | Disposition: A | Discharge status: Home / Self Care | Payer: Medicaid Other | Attending: Obstetrics and Gynecology | Admitting: Obstetrics and Gynecology

## 2015-12-31 ENCOUNTER — Encounter: Payer: Self-pay | Admitting: *Deleted

## 2015-12-31 DIAGNOSIS — Z3493 Encounter for supervision of normal pregnancy, unspecified, third trimester: Secondary | ICD-10-CM

## 2015-12-31 DIAGNOSIS — Z79899 Other long term (current) drug therapy: Secondary | ICD-10-CM | POA: Diagnosis not present

## 2015-12-31 DIAGNOSIS — O99334 Smoking (tobacco) complicating childbirth: Secondary | ICD-10-CM | POA: Diagnosis present

## 2015-12-31 DIAGNOSIS — Z825 Family history of asthma and other chronic lower respiratory diseases: Secondary | ICD-10-CM

## 2015-12-31 DIAGNOSIS — F17210 Nicotine dependence, cigarettes, uncomplicated: Secondary | ICD-10-CM | POA: Diagnosis present

## 2015-12-31 DIAGNOSIS — Z302 Encounter for sterilization: Secondary | ICD-10-CM | POA: Diagnosis not present

## 2015-12-31 DIAGNOSIS — Z9889 Other specified postprocedural states: Secondary | ICD-10-CM

## 2015-12-31 DIAGNOSIS — Z3A39 39 weeks gestation of pregnancy: Secondary | ICD-10-CM

## 2015-12-31 DIAGNOSIS — O99824 Streptococcus B carrier state complicating childbirth: Secondary | ICD-10-CM | POA: Diagnosis present

## 2015-12-31 DIAGNOSIS — O99344 Other mental disorders complicating childbirth: Secondary | ICD-10-CM | POA: Diagnosis present

## 2015-12-31 DIAGNOSIS — F319 Bipolar disorder, unspecified: Secondary | ICD-10-CM | POA: Diagnosis present

## 2015-12-31 DIAGNOSIS — O99119 Other diseases of the blood and blood-forming organs and certain disorders involving the immune mechanism complicating pregnancy, unspecified trimester: Secondary | ICD-10-CM

## 2015-12-31 DIAGNOSIS — D6959 Other secondary thrombocytopenia: Secondary | ICD-10-CM | POA: Diagnosis present

## 2015-12-31 DIAGNOSIS — D696 Thrombocytopenia, unspecified: Secondary | ICD-10-CM

## 2015-12-31 DIAGNOSIS — R109 Unspecified abdominal pain: Secondary | ICD-10-CM | POA: Diagnosis present

## 2015-12-31 DIAGNOSIS — O9912 Other diseases of the blood and blood-forming organs and certain disorders involving the immune mechanism complicating childbirth: Principal | ICD-10-CM | POA: Diagnosis present

## 2015-12-31 DIAGNOSIS — Z833 Family history of diabetes mellitus: Secondary | ICD-10-CM

## 2015-12-31 DIAGNOSIS — IMO0001 Reserved for inherently not codable concepts without codable children: Secondary | ICD-10-CM

## 2015-12-31 HISTORY — DX: Major depressive disorder, single episode, unspecified: F32.9

## 2015-12-31 HISTORY — DX: Depression, unspecified: F32.A

## 2015-12-31 LAB — RAPID HIV SCREEN (HIV 1/2 AB+AG)
HIV 1/2 Antibodies: NONREACTIVE
HIV-1 P24 ANTIGEN - HIV24: NONREACTIVE

## 2015-12-31 LAB — TYPE AND SCREEN
ABO/RH(D): O POS
Antibody Screen: NEGATIVE

## 2015-12-31 LAB — CBC
HCT: 33.4 % — ABNORMAL LOW (ref 35.0–47.0)
Hemoglobin: 11.5 g/dL — ABNORMAL LOW (ref 12.0–16.0)
MCH: 29.2 pg (ref 26.0–34.0)
MCHC: 34.4 g/dL (ref 32.0–36.0)
MCV: 85 fL (ref 80.0–100.0)
PLATELETS: 120 10*3/uL — AB (ref 150–440)
RBC: 3.93 MIL/uL (ref 3.80–5.20)
RDW: 14.5 % (ref 11.5–14.5)
WBC: 13.8 10*3/uL — ABNORMAL HIGH (ref 3.6–11.0)

## 2015-12-31 LAB — URINE DRUG SCREEN, QUALITATIVE (ARMC ONLY)
Amphetamines, Ur Screen: NOT DETECTED
BARBITURATES, UR SCREEN: NOT DETECTED
BENZODIAZEPINE, UR SCRN: NOT DETECTED
CANNABINOID 50 NG, UR ~~LOC~~: POSITIVE — AB
Cocaine Metabolite,Ur ~~LOC~~: NOT DETECTED
MDMA (Ecstasy)Ur Screen: NOT DETECTED
Methadone Scn, Ur: NOT DETECTED
OPIATE, UR SCREEN: NOT DETECTED
PHENCYCLIDINE (PCP) UR S: NOT DETECTED
Tricyclic, Ur Screen: NOT DETECTED

## 2015-12-31 MED ORDER — ACETAMINOPHEN 325 MG PO TABS: 650.0000 mg | ORAL_TABLET | ORAL | Status: DC | PRN

## 2015-12-31 MED ORDER — ONDANSETRON HCL 4 MG PO TABS
ORAL_TABLET | ORAL | Status: AC
  Administered 2015-12-31: 4 mg via ORAL
  Filled 2015-12-31: qty 1

## 2015-12-31 MED ORDER — SENNOSIDES-DOCUSATE SODIUM 8.6-50 MG PO TABS: 2.0000 | ORAL_TABLET | ORAL | Status: DC

## 2015-12-31 MED ORDER — DIBUCAINE 1 % RE OINT: 1.0000 "application " | TOPICAL_OINTMENT | RECTAL | Status: DC | PRN

## 2015-12-31 MED ORDER — SODIUM CHLORIDE 0.9 % IV SOLN
2.0000 g | Freq: Once | INTRAVENOUS | Status: AC
  Administered 2015-12-31: 2 g via INTRAVENOUS
  Filled 2015-12-31: qty 2000

## 2015-12-31 MED ORDER — BUTORPHANOL TARTRATE 1 MG/ML IJ SOLN
1.0000 mg | INTRAMUSCULAR | Status: DC | PRN
  Filled 2015-12-31: qty 1

## 2015-12-31 MED ORDER — ONDANSETRON HCL 4 MG PO TABS
4.0000 mg | ORAL_TABLET | Freq: Once | ORAL | Status: DC
  Administered 2015-12-31: 4 mg via ORAL

## 2015-12-31 MED ORDER — WITCH HAZEL-GLYCERIN EX PADS: 1.0000 "application " | MEDICATED_PAD | CUTANEOUS | Status: DC | PRN

## 2015-12-31 MED ORDER — ONDANSETRON HCL 4 MG PO TABS
4.0000 mg | ORAL_TABLET | ORAL | Status: DC | PRN
  Filled 2015-12-31: qty 1

## 2015-12-31 MED ORDER — BENZOCAINE-MENTHOL 20-0.5 % EX AERO
1.0000 "application " | INHALATION_SPRAY | CUTANEOUS | Status: DC | PRN
  Filled 2015-12-31: qty 56

## 2015-12-31 MED ORDER — METOCLOPRAMIDE HCL 10 MG PO TABS
10.0000 mg | ORAL_TABLET | Freq: Once | ORAL | Status: AC
  Administered 2015-12-31: 10 mg via ORAL
  Filled 2015-12-31: qty 1

## 2015-12-31 MED ORDER — LACTATED RINGERS IV SOLN
INTRAVENOUS | Status: DC
  Administered 2015-12-31: 20 mL/h via INTRAVENOUS

## 2015-12-31 MED ORDER — ONDANSETRON HCL 4 MG/2ML IJ SOLN: 4.0000 mg | Freq: Four times a day (QID) | INTRAMUSCULAR | Status: DC | PRN

## 2015-12-31 MED ORDER — LIDOCAINE HCL (PF) 1 % IJ SOLN
INTRAMUSCULAR | Status: AC
  Filled 2015-12-31: qty 30

## 2015-12-31 MED ORDER — LACTATED RINGERS IV SOLN
INTRAVENOUS | Status: DC
  Administered 2015-12-31: 09:00:00 via INTRAVENOUS

## 2015-12-31 MED ORDER — ZOLPIDEM TARTRATE 5 MG PO TABS: 5.0000 mg | ORAL_TABLET | Freq: Every evening | ORAL | Status: DC | PRN

## 2015-12-31 MED ORDER — LIDOCAINE HCL (PF) 1 % IJ SOLN: 30.0000 mL | INTRAMUSCULAR | Status: DC | PRN

## 2015-12-31 MED ORDER — AMMONIA AROMATIC IN INHA
RESPIRATORY_TRACT | Status: AC
  Filled 2015-12-31: qty 10

## 2015-12-31 MED ORDER — OXYCODONE-ACETAMINOPHEN 5-325 MG PO TABS: 1.0000 | ORAL_TABLET | Freq: Once | ORAL | Status: DC

## 2015-12-31 MED ORDER — DIPHENHYDRAMINE HCL 25 MG PO CAPS: 25.0000 mg | ORAL_CAPSULE | Freq: Four times a day (QID) | ORAL | Status: DC | PRN

## 2015-12-31 MED ORDER — FAMOTIDINE 20 MG PO TABS
40.0000 mg | ORAL_TABLET | Freq: Once | ORAL | Status: AC
  Administered 2015-12-31: 40 mg via ORAL
  Filled 2015-12-31: qty 2

## 2015-12-31 MED ORDER — OXYCODONE-ACETAMINOPHEN 5-325 MG PO TABS
2.0000 | ORAL_TABLET | ORAL | Status: DC | PRN
  Administered 2015-12-31: 2 via ORAL
  Filled 2015-12-31: qty 2

## 2015-12-31 MED ORDER — LACTATED RINGERS IV SOLN
500.0000 mL | INTRAVENOUS | Status: DC | PRN
Start: 2015-12-31 — End: 2015-12-31

## 2015-12-31 MED ORDER — ONDANSETRON HCL 4 MG/2ML IJ SOLN: 4.0000 mg | INTRAMUSCULAR | Status: DC | PRN

## 2015-12-31 MED ORDER — COCONUT OIL OIL
1.0000 "application " | TOPICAL_OIL | Status: DC | PRN
  Filled 2015-12-31: qty 120

## 2015-12-31 MED ORDER — OXYTOCIN 40 UNITS IN LACTATED RINGERS INFUSION - SIMPLE MED
INTRAVENOUS | Status: AC
  Filled 2015-12-31: qty 1000

## 2015-12-31 MED ORDER — AMPICILLIN SODIUM 1 G IJ SOLR: 1.0000 g | INTRAMUSCULAR | Status: DC

## 2015-12-31 MED ORDER — OXYTOCIN BOLUS FROM INFUSION: 500.0000 mL | Freq: Once | INTRAVENOUS | Status: DC

## 2015-12-31 MED ORDER — IBUPROFEN 600 MG PO TABS
600.0000 mg | ORAL_TABLET | Freq: Four times a day (QID) | ORAL | Status: DC
  Administered 2015-12-31 (×2): 600 mg via ORAL
  Filled 2015-12-31 (×2): qty 1

## 2015-12-31 MED ORDER — OXYCODONE-ACETAMINOPHEN 5-325 MG PO TABS
1.0000 | ORAL_TABLET | Freq: Four times a day (QID) | ORAL | Status: DC | PRN
  Administered 2015-12-31 – 2016-01-01 (×5): 2 via ORAL
  Administered 2016-01-02 (×2): 1 via ORAL
  Administered 2016-01-02 (×3): 2 via ORAL
  Filled 2015-12-31: qty 1
  Filled 2015-12-31 (×3): qty 2
  Filled 2015-12-31: qty 1
  Filled 2015-12-31 (×5): qty 2
  Filled 2015-12-31 (×2): qty 1

## 2015-12-31 MED ORDER — SOD CITRATE-CITRIC ACID 500-334 MG/5ML PO SOLN: 30.0000 mL | ORAL | Status: DC | PRN

## 2015-12-31 MED ORDER — OXYCODONE-ACETAMINOPHEN 5-325 MG PO TABS: 1.0000 | ORAL_TABLET | ORAL | Status: DC | PRN

## 2015-12-31 MED ORDER — BUPRENORPHINE HCL 8 MG SL SUBL
4.0000 mg | SUBLINGUAL_TABLET | Freq: Two times a day (BID) | SUBLINGUAL | Status: DC
  Administered 2015-12-31 – 2016-01-02 (×5): 4 mg via SUBLINGUAL
  Filled 2015-12-31 (×5): qty 1

## 2015-12-31 MED ORDER — MISOPROSTOL 200 MCG PO TABS
ORAL_TABLET | ORAL | Status: AC
  Filled 2015-12-31: qty 4

## 2015-12-31 MED ORDER — PRENATAL MULTIVITAMIN CH
1.0000 | ORAL_TABLET | Freq: Every day | ORAL | Status: DC
  Administered 2015-12-31: 1 via ORAL
  Filled 2015-12-31: qty 1

## 2015-12-31 MED ORDER — OXYTOCIN 40 UNITS IN LACTATED RINGERS INFUSION - SIMPLE MED: 2.5000 [IU]/h | INTRAVENOUS | Status: DC

## 2015-12-31 MED ORDER — OXYTOCIN 10 UNIT/ML IJ SOLN
INTRAMUSCULAR | Status: AC
  Filled 2015-12-31: qty 2

## 2015-12-31 MED ORDER — SIMETHICONE 80 MG PO CHEW: 80.0000 mg | CHEWABLE_TABLET | ORAL | Status: DC | PRN

## 2015-12-31 NOTE — Clinical Social Work Maternal (Signed)
  CLINICAL SOCIAL WORK MATERNAL/CHILD NOTE  Patient Details  Name: Debbie Padilla MRN: [id number] Date of Birth: 10/31/1985  Date:  12/31/2015  Clinical Social Worker Initiating Note:  Argentina PonderKaren Martha Zuria Fosdick Date/ Time Initiated:  12/31/15/1609     Child's Name:   Debbie Padilla   Legal Guardian:  Mother   Need for Interpreter:  None   Date of Referral:  12/31/15     Reason for Referral:  Current Substance Use/Substance Use During Pregnancy    Referral Source:  RN   Address:  [address] [place], [place], [place] [address]  Phone number:  [phone]   Household Members:  Self, Minor Children, Relatives   Natural Supports (not living in the home):  Spouse/significant other, Other (Comment) (Parents of the baby's father)   Professional Supports: Organized support group (Comment), Therapist ([facility]ex [facility])   Employment: Environmental education officerull-time   Type of Work: Surveyor, [employer]   Education:  Associate ProfessorHigh school graduate   Financial Resources:  Medicaid   Other Resources:  Sales executiveood Stamps , [facility]   Cultural/Religious Considerations Which May Impact Care:  N/A  Strengths:  Ability to meet basic needs , Compliance with medical plan , Home prepared for child , Pediatrician chosen , Understanding of illness, Other (Comment) (Compliant with treatment for substance use)   Risk Factors/Current Problems:  Substance Use  (Patient positive for cannibis, baby's results pending)   Cognitive State:  Able to Concentrate , Alert , Insightful    Mood/Affect:  Calm  (Appropriate for recent birth; pleasant and cooperative)   CSW Assessment: CSW involvement due to mother showing positive for cannabis use. Baby's results pending. Patient is [facility] for cannabis and tobacco use (last cannabis use 3 weeks ago). Patient reports no other substance or ETOH use while pregnant. Patient also compliant  with Subutex medication management through the same provider. Patient able to verbalize in her own terms how to access resources for her family such as WIC, Medicaid, and SNAP.   Patient has 2 girls and 1 boy at home and childcare support from her grandmother who lives in the home. Patient reports no prior involvement with CPS or DSS. CSW advised patient of mandated reporting protocol pending the baby's screening results. Patient verbalized understanding the process and thanked the CSW for information.   Patient was pleasant and compliant.  CSW Plan/Description:  Information/Referral to [facility] , Other (Comment) (CPS report pending baby's urinalysis/meconin )    Judi CongKaren M Riannon Mukherjee, LCSW 12/31/2015, 4:17 PM

## 2015-12-31 NOTE — H&P (Signed)
Obstetric History and Physical  Debbie Padilla is a 30 y.o. [phone] with IUP at [redacted]w[redacted]d presenting for complaints of contractions. Patient states she has been having  regular, every 2-3 minutes contractions, none vaginal bleeding, intact membranes, with active fetal movement.    Prenatal Course Source of Care: [facility]  with onset of care at 26 weeks Pregnancy complications or risks: Patient Active Problem List   Diagnosis Date Noted  . Abdominal pain 12/31/2015  . Active labor at term 12/31/2015  . Positive GBS test 12/27/2015  . Bipolar disorder (HCC) 12/07/2015  . H/O premature rupture of membranes in previous pregnancy, currently pregnant 11/10/2015  . H/O preterm delivery, currently pregnant 11/10/2015  . Prenatal care insufficient 11/10/2015  . Short interval between pregnancies affecting pregnancy in third trimester, antepartum 11/10/2015  . Abnormal drug screen 09/27/2015  . Tobacco abuse 10/06/2014   She plans to bottle feed She desires bilateral tubal ligation for postpartum contraception.   Prenatal labs and studies: ABO, Rh: O Positive (08/12 0928) Antibody: Negative (08/12 0928) Rubella: 1.31 (05/08 1554) RPR: Non Reactive (05/08 1554)  HBsAg: Negative (05/08 1554)  HIV: Non Reactive (05/08 1554)  GBS:  Positive (07/19 1100) 1 hr Glucola  normal Genetic screening: not performed due to advanced gestation, declined cell-free DNA testing Anatomy US normal   Past Medical History:  Diagnosis Date  . Anxiety   . Bipolar disorder (HCC)   . Depression   . Preterm labor     Past Surgical History:  Procedure Laterality Date  . ANKLE INCISION AND DRAINAGE Left July 2012  . DILATION AND CURETTAGE OF UTERUS      OB History  Gravida Para Term Preterm AB Living  7 3 2 1 3 3   SAB TAB Ectopic Multiple Live Births  2 1   0 3    # Outcome Date GA Lbr Len/2nd Weight Sex Delivery Anes PTL Lv  7 Current           6 Term 10/06/14 [redacted]w[redacted]d 06:50 / 00:22  6 lb 2.8 oz (2.8 kg) M Vag-Spont EPI  LIV  5 SAB 08/04/13          4 SAB 11/04/08          3 Preterm 09/06/08 [redacted]w[redacted]d   F Vag-Spont EPI  LIV  2 Term 02/16/04 [redacted]w[redacted]d   F Vag-Spont EPI  LIV  1 TAB 11/03/02            Obstetric Comments  Pt stating she has had 6 pregnancies- not discussing TAB, note family present    Social History   Social History  . Marital status: Single    Spouse name: N/A  . Number of children: N/A  . Years of education: N/A   Social History Main Topics  . Smoking status: Current Every Day Smoker    Packs/day: 0.25    Years: 11.00    Types: Cigarettes  . Smokeless tobacco: Never Used  . Alcohol use No  . Drug use:     Types: Marijuana     Comment: States was taking it for appetite but has stopped since 1 month  . Sexual activity: Yes    Birth control/ protection: None   Other Topics Concern  . None   Social History Narrative  . None    Family History  Problem Relation Age of Onset  . Asthma Daughter   . Asthma Paternal Grandmother   . Diabetes Paternal Grandmother   . Cancer Neg  Hx   . Heart disease Neg Hx     Prescriptions Prior to Admission  Medication Sig Dispense Refill Last Dose  . buprenorphine (SUBUTEX) 8 MG SUBL SL tablet Place 8 mg under the tongue daily.   12/30/2015 at 1900  . Prenatal Vit-Fe Fumarate-FA (MULTIVITAMIN-PRENATAL) 27-0.8 MG TABS tablet Take 1 tablet by mouth daily at 12 noon.   Past Week at Unknown time  . promethazine (PHENERGAN) 25 MG tablet Take 1 tablet (25 mg total) by mouth every 6 (six) hours as needed for nausea or vomiting. (Patient not taking: Reported on 12/31/2015) 30 tablet 1 Not Taking at Unknown time    No Known Allergies  Review of Systems: Negative except for what is mentioned in HPI.  Physical Exam: BP 132/87   Pulse 79   Temp 98 F (36.7 C) (Oral)   Resp 18   Ht  (1.575 m)   Wt 164 lb (74.4 kg)   LMP 05/04/2015 (Approximate)   BMI 30.00 kg/m  CONSTITUTIONAL: Well-developed,  well-nourished female in no acute distress.  HENT:  Normocephalic, atraumatic, External right and left ear normal. Oropharynx is clear and moist EYES: Conjunctivae and EOM are normal. Pupils are equal, round, and reactive to light. No scleral icterus.  NECK: Normal range of motion, supple, no masses SKIN: Skin is warm and dry. No rash noted. Not diaphoretic. No erythema. No pallor. NEUROLOGIC: Alert and oriented to person, place, and time. Normal reflexes, muscle tone coordination. No cranial nerve deficit noted. PSYCHIATRIC: Normal mood and affect. Normal behavior. Normal judgment and thought content. CARDIOVASCULAR: Normal heart rate noted, regular rhythm RESPIRATORY: Effort and breath sounds normal, no problems with respiration noted ABDOMEN: Soft, nontender, nondistended, gravid. MUSCULOSKELETAL: Normal range of motion. No edema and no tenderness. 2+ distal pulses.  Cervical Exam: Dilatation 4 cm   Effacement 60%   Station -3   Presentation: cephalic FHT:  Baseline rate 130 bpm   Variability moderate  Accelerations present   Decelerations absent. Contractions: Every 1-2 mins   Pertinent Labs/Studies:   Results for orders placed or performed during the hospital encounter of 12/31/15 (from the past 24 hour(s))  Rapid HIV screen (HIV 1/2 Ab+Ag) ([facility] Only)     Status: None   Collection Time: 12/31/15  9:28 AM  Result Value Ref Range   HIV-1 P24 Antigen - HIV24 NON REACTIVE NON REACTIVE   HIV 1/2 Antibodies NON REACTIVE NON REACTIVE   Interpretation (HIV Ag Ab)      A non reactive test result means that HIV 1 or HIV 2 antibodies and HIV 1 p24 antigen were not detected in the specimen.  CBC     Status: Abnormal   Collection Time: 12/31/15  9:28 AM  Result Value Ref Range   WBC 13.8 (H) 3.6 - 11.0 K/uL   RBC 3.93 3.80 - 5.20 MIL/uL   Hemoglobin 11.5 (L) 12.0 - 16.0 g/dL   HCT 78.2 (L) 95.6 - 21.3 %   MCV 85.0 80.0 - 100.0 fL   MCH 29.2 26.0 - 34.0 pg   MCHC 34.4 32.0 - 36.0 g/dL    RDW 08.6 57.8 - 46.9 %   Platelets 120 (L) 150 - 440 K/uL  Type and screen [facility]     Status: None (Preliminary result)   Collection Time: 12/31/15  9:28 AM  Result Value Ref Range   ABO/RH(D) PENDING    Antibody Screen PENDING    Sample Expiration 01/03/2016     Assessment :  Arvella NighCourtney Nicole Champoux is a 30 y.o. [phone] at 1324w3d being admitted for labor.  Plan: Labor: Expectant management.  Induction/Augmentation as needed, per protocol.  H/o +UDS (marijuana), will get UDS.  FWB: Reassuring fetal heart tracing.  GBS positive.  Will treat with Ampicillin.  Delivery plan: Hopeful for vaginal delivery   Hildred LaserAnika Jojuan Champney, MD [facility]

## 2015-12-31 NOTE — Progress Notes (Signed)
Pt up to wheelchair to transfer to post-partum room 348.

## 2016-01-01 ENCOUNTER — Inpatient Hospital Stay: Payer: Medicaid Other | Admitting: Certified Registered Nurse Anesthetist

## 2016-01-01 ENCOUNTER — Encounter
Admission: EM | Disposition: A | Discharge status: Home / Self Care | Payer: Self-pay | Source: Home / Self Care | Attending: Obstetrics and Gynecology

## 2016-01-01 DIAGNOSIS — O99119 Other diseases of the blood and blood-forming organs and certain disorders involving the immune mechanism complicating pregnancy, unspecified trimester: Secondary | ICD-10-CM

## 2016-01-01 DIAGNOSIS — D696 Thrombocytopenia, unspecified: Secondary | ICD-10-CM

## 2016-01-01 HISTORY — PX: TUBAL LIGATION: SHX77

## 2016-01-01 LAB — CBC
HCT: 29 % — ABNORMAL LOW (ref 35.0–47.0)
Hemoglobin: 10 g/dL — ABNORMAL LOW (ref 12.0–16.0)
MCH: 29.3 pg (ref 26.0–34.0)
MCHC: 34.6 g/dL (ref 32.0–36.0)
MCV: 84.6 fL (ref 80.0–100.0)
PLATELETS: 118 10*3/uL — AB (ref 150–440)
RBC: 3.42 MIL/uL — AB (ref 3.80–5.20)
RDW: 14.6 % — ABNORMAL HIGH (ref 11.5–14.5)
WBC: 16 10*3/uL — AB (ref 3.6–11.0)

## 2016-01-01 LAB — RPR: RPR: NONREACTIVE

## 2016-01-01 SURGERY — LIGATION, FALLOPIAN TUBE, POSTPARTUM
Anesthesia: Spinal | Laterality: Bilateral

## 2016-01-01 SURGERY — LIGATION, FALLOPIAN TUBE, BILATERAL
Anesthesia: General | Laterality: Bilateral

## 2016-01-01 MED ORDER — ACETAMINOPHEN 325 MG PO TABS: 650.0000 mg | ORAL_TABLET | ORAL | Status: DC | PRN

## 2016-01-01 MED ORDER — MEPERIDINE HCL 25 MG/ML IJ SOLN: 6.2500 mg | INTRAMUSCULAR | Status: DC | PRN

## 2016-01-01 MED ORDER — PROMETHAZINE HCL 25 MG/ML IJ SOLN
6.2500 mg | INTRAMUSCULAR | Status: DC | PRN
  Administered 2016-01-01: 6.25 mg via INTRAVENOUS

## 2016-01-01 MED ORDER — PROMETHAZINE HCL 25 MG/ML IJ SOLN
INTRAMUSCULAR | Status: AC
Start: 2016-01-01 — End: 2016-01-01
  Administered 2016-01-01: 6.25 mg via INTRAVENOUS
  Filled 2016-01-01: qty 1

## 2016-01-01 MED ORDER — FENTANYL CITRATE (PF) 100 MCG/2ML IJ SOLN
25.0000 ug | INTRAMUSCULAR | Status: DC | PRN
  Administered 2016-01-01: 25 ug via INTRAVENOUS

## 2016-01-01 MED ORDER — DIBUCAINE 1 % RE OINT: 1.0000 "application " | TOPICAL_OINTMENT | RECTAL | Status: DC | PRN

## 2016-01-01 MED ORDER — BUPIVACAINE IN DEXTROSE 0.75-8.25 % IT SOLN
INTRATHECAL | Status: DC | PRN
  Administered 2016-01-01: 1.8 mL via INTRATHECAL

## 2016-01-01 MED ORDER — SODIUM CHLORIDE 0.9 % IJ SOLN
INTRAMUSCULAR | Status: AC
  Filled 2016-01-01: qty 10

## 2016-01-01 MED ORDER — LACTATED RINGERS IV SOLN: INTRAVENOUS | Status: DC

## 2016-01-01 MED ORDER — MISOPROSTOL 200 MCG PO TABS: 200.0000 ug | ORAL_TABLET | Freq: Once | ORAL | Status: DC | PRN

## 2016-01-01 MED ORDER — MIDAZOLAM HCL 5 MG/5ML IJ SOLN
INTRAMUSCULAR | Status: DC | PRN
  Administered 2016-01-01 (×2): 1 mg via INTRAVENOUS
  Administered 2016-01-01: 2 mg via INTRAVENOUS

## 2016-01-01 MED ORDER — LACTATED RINGERS IV SOLN
INTRAVENOUS | Status: DC | PRN
  Administered 2016-01-01: 12:00:00 via INTRAVENOUS

## 2016-01-01 MED ORDER — COCONUT OIL OIL: 1.0000 "application " | TOPICAL_OIL | Status: DC | PRN

## 2016-01-01 MED ORDER — BUPIVACAINE HCL 0.5 % IJ SOLN
INTRAMUSCULAR | Status: DC | PRN
  Administered 2016-01-01: 9 mL

## 2016-01-01 MED ORDER — ONDANSETRON HCL 4 MG/2ML IJ SOLN
4.0000 mg | INTRAMUSCULAR | Status: DC | PRN
  Administered 2016-01-01: 4 mg via INTRAVENOUS
  Filled 2016-01-01: qty 2

## 2016-01-01 MED ORDER — ZOLPIDEM TARTRATE 5 MG PO TABS: 5.0000 mg | ORAL_TABLET | Freq: Every evening | ORAL | Status: DC | PRN

## 2016-01-01 MED ORDER — ONDANSETRON HCL 4 MG PO TABS: 4.0000 mg | ORAL_TABLET | ORAL | Status: DC | PRN

## 2016-01-01 MED ORDER — FENTANYL CITRATE (PF) 100 MCG/2ML IJ SOLN
INTRAMUSCULAR | Status: AC
  Administered 2016-01-01: 25 ug via INTRAVENOUS
  Filled 2016-01-01: qty 2

## 2016-01-01 MED ORDER — BUPIVACAINE HCL (PF) 0.5 % IJ SOLN
INTRAMUSCULAR | Status: AC
  Filled 2016-01-01: qty 30

## 2016-01-01 MED ORDER — OXYCODONE HCL 5 MG/5ML PO SOLN: 5.0000 mg | Freq: Once | ORAL | Status: DC | PRN

## 2016-01-01 MED ORDER — IBUPROFEN 600 MG PO TABS
600.0000 mg | ORAL_TABLET | Freq: Four times a day (QID) | ORAL | Status: DC
  Administered 2016-01-01 – 2016-01-02 (×6): 600 mg via ORAL
  Filled 2016-01-01 (×7): qty 1

## 2016-01-01 MED ORDER — BENZOCAINE-MENTHOL 20-0.5 % EX AERO: 1.0000 "application " | INHALATION_SPRAY | CUTANEOUS | Status: DC | PRN

## 2016-01-01 MED ORDER — SIMETHICONE 80 MG PO CHEW: 80.0000 mg | CHEWABLE_TABLET | ORAL | Status: DC | PRN

## 2016-01-01 MED ORDER — WITCH HAZEL-GLYCERIN EX PADS: 1.0000 "application " | MEDICATED_PAD | CUTANEOUS | Status: DC | PRN

## 2016-01-01 MED ORDER — AMMONIA AROMATIC IN INHA: 0.3000 mL | Freq: Once | RESPIRATORY_TRACT | Status: DC | PRN

## 2016-01-01 MED ORDER — PRENATAL MULTIVITAMIN CH
1.0000 | ORAL_TABLET | Freq: Every day | ORAL | Status: DC
  Administered 2016-01-01 – 2016-01-02 (×2): 1 via ORAL
  Filled 2016-01-01 (×2): qty 1

## 2016-01-01 MED ORDER — DIPHENHYDRAMINE HCL 25 MG PO CAPS: 25.0000 mg | ORAL_CAPSULE | Freq: Four times a day (QID) | ORAL | Status: DC | PRN

## 2016-01-01 MED ORDER — OXYCODONE HCL 5 MG PO TABS: 5.0000 mg | ORAL_TABLET | Freq: Once | ORAL | Status: DC | PRN

## 2016-01-01 MED ORDER — GLYCOPYRROLATE 0.2 MG/ML IJ SOLN
INTRAMUSCULAR | Status: DC | PRN
  Administered 2016-01-01: 0.2 mg via INTRAVENOUS

## 2016-01-01 MED ORDER — OXYTOCIN 10 UNIT/ML IJ SOLN
10.0000 [IU] | Freq: Once | INTRAMUSCULAR | Status: DC | PRN
  Filled 2016-01-01: qty 1

## 2016-01-01 MED ORDER — SENNOSIDES-DOCUSATE SODIUM 8.6-50 MG PO TABS
2.0000 | ORAL_TABLET | ORAL | Status: DC
  Administered 2016-01-01 – 2016-01-02 (×3): 2 via ORAL
  Filled 2016-01-01 (×2): qty 2

## 2016-01-01 SURGICAL SUPPLY — 25 items
BLADE SURG SZ11 CARB STEEL (BLADE) ×3 IMPLANT
CHLORAPREP W/TINT 26ML (MISCELLANEOUS) ×3 IMPLANT
DRAPE LAPAROTOMY 100X77 ABD (DRAPES) ×3 IMPLANT
DRSG TEGADERM 2-3/8X2-3/4 SM (GAUZE/BANDAGES/DRESSINGS) ×3 IMPLANT
GAUZE SPONGE NON-WVN 2X2 STRL (MISCELLANEOUS) ×1 IMPLANT
GLOVE BIO SURGEON STRL SZ 6.5 (GLOVE) ×6 IMPLANT
GLOVE BIO SURGEONS STRL SZ 6.5 (GLOVE) ×3
GLOVE INDICATOR 7.0 STRL GRN (GLOVE) ×3 IMPLANT
GOWN STRL REUS W/ TWL LRG LVL3 (GOWN DISPOSABLE) ×2 IMPLANT
GOWN STRL REUS W/TWL LRG LVL3 (GOWN DISPOSABLE) ×4
KIT RM TURNOVER CYSTO AR (KITS) ×3 IMPLANT
LABEL OR SOLS (LABEL) IMPLANT
LIQUID BAND (GAUZE/BANDAGES/DRESSINGS) ×3 IMPLANT
NEEDLE HYPO 25GX1X1/2 BEV (NEEDLE) ×3 IMPLANT
NS IRRIG 500ML POUR BTL (IV SOLUTION) ×3 IMPLANT
PACK BASIN MINOR ARMC (MISCELLANEOUS) ×3 IMPLANT
SPONGE VERSALON 2X2 STRL (MISCELLANEOUS) ×2
SUT MNCRL 4-0 (SUTURE) ×2
SUT MNCRL 4-0 27XMFL (SUTURE) ×1
SUT PLAIN GUT 0 (SUTURE) ×6 IMPLANT
SUT VIC AB 0 CT1 36 (SUTURE) ×3 IMPLANT
SUT VIC AB 0 SH 27 (SUTURE) ×3 IMPLANT
SUT VICRYL 0 AB UR-6 (SUTURE) ×6 IMPLANT
SUTURE MNCRL 4-0 27XMF (SUTURE) ×1 IMPLANT
SYRINGE 10CC LL (SYRINGE) ×3 IMPLANT

## 2016-01-01 NOTE — Anesthesia Postprocedure Evaluation (Signed)
Anesthesia Post Note  Patient: Debbie Padilla  Procedure(s) Performed: Procedure(s) (LRB): POST PARTUM TUBAL LIGATION (Bilateral)  Patient location during evaluation: PACU Anesthesia Type: Spinal Level of consciousness: oriented and awake and alert Pain management: pain level controlled Vital Signs Assessment: post-procedure vital signs reviewed and stable Respiratory status: spontaneous breathing, respiratory function stable and nonlabored ventilation Cardiovascular status: blood pressure returned to baseline and stable Postop Assessment: no headache, no backache and spinal receding Anesthetic complications: no    Last Vitals:  Vitals:   01/01/16 1315 01/01/16 1415  BP: 104/61   Pulse: 74   Resp: 18   Temp: 36.3 C 36.3 C    Last Pain:  Vitals:   01/01/16 1408  TempSrc:   PainSc: 4     LLE Motor Response: Purposeful movement (01/01/16 1416)   RLE Motor Response: Purposeful movement (01/01/16 1416)        Ahron Hulbert

## 2016-01-01 NOTE — OR Nursing (Signed)
Patient resting well some movement in feet patient had crackers and a drink tolerated well

## 2016-01-01 NOTE — Transfer of Care (Signed)
Immediate Anesthesia Transfer of Care Note  Patient: Debbie Padilla  Procedure(s) Performed: Procedure(s): POST PARTUM TUBAL LIGATION (Bilateral)  Patient Location: PACU  Anesthesia Type:Spinal  Level of Consciousness: awake, alert  and oriented  Airway & Oxygen Therapy: Patient Spontanous Breathing  Post-op Assessment: Report given to RN and Post -op Vital signs reviewed and stable  Post vital signs: Reviewed  Last Vitals:  Vitals:   01/01/16 1212 01/01/16 1315  BP:  104/61  Pulse:  74  Resp:  18  Temp: 36.8 C 36.3 C    Last Pain:  Vitals:   01/01/16 1212  TempSrc: Oral  PainSc:          Complications: No apparent anesthesia complications

## 2016-01-01 NOTE — Anesthesia Procedure Notes (Signed)
Spinal  Patient location during procedure: OR Start time: 01/01/2016 12:20 PM End time: 01/01/2016 12:28 PM Staffing Anesthesiologist: Randa Lynn, AMY Resident/CRNA: Rolla Plate Performed: resident/CRNA  Preanesthetic Checklist Completed: patient identified, site marked, surgical consent, pre-op evaluation, timeout performed, IV checked, risks and benefits discussed and monitors and equipment checked Spinal Block Patient position: sitting Prep: ChloraPrep Patient monitoring: heart rate, continuous pulse ox, blood pressure and cardiac monitor Approach: midline Location: L4-5 Injection technique: single-shot Needle Needle type: Whitacre and Introducer  Needle gauge: 25 G Needle length: 9 cm Additional Notes Negative paresthesia. Negative blood return. Positive free-flowing CSF. Expiration date of kit checked and confirmed. Patient tolerated procedure well, without complications.

## 2016-01-01 NOTE — OR Nursing (Signed)
Patient c/o nausea and pain phenergan 6.25 and fent given

## 2016-01-01 NOTE — Progress Notes (Signed)
Post Partum Day # 1, s/p SVD  Subjective: up ad lib, voiding and tolerating PO.  Does report mild to moderate soreness in left abdomen with fundal checks.   Objective: Temp:  [98 F (36.7 C)-98.5 F (36.9 C)] 98.2 F (36.8 C) (08/13 0351) Pulse Rate:  [54-96] 70 (08/13 0351) Resp:  [14-20] 18 (08/13 0351) BP: (107-147)/(63-80) 126/68 (08/13 0351) SpO2:  [99 %-100 %] 99 % (08/12 1304) Weight:  [167 lb (75.8 kg)] 167 lb (75.8 kg) (08/12 1045)  Physical Exam:  General: alert and no distress  Lungs: clear to auscultation bilaterally Breasts: normal appearance, no masses or tenderness Heart: regular rate and rhythm, S1, S2 normal, no murmur, click, rub or gallop Pelvis: Lochia: appropriate, Uterine Fundus: firm.  Mild tenderness in left abdomen.  Extremities: DVT Evaluation: Negative Homan's sign. No cords or calf tenderness. No significant calf/ankle edema.   CBC Latest Ref Rng & Units 01/01/2016 12/31/2015 09/26/2015  WBC 3.6 - 11.0 K/uL 16.0(H) 13.8(H) 10.9(H)  Hemoglobin 12.0 - 16.0 g/dL 10.0(L) 11.5(L) -  Hematocrit 35.0 - 47.0 % 29.0(L) 33.4(L) 33.3(L)  Platelets 150 - 440 K/uL 118(L) 120(L) 183    Assessment/Plan: Plan for discharge tomorrow, Social Work consult and Contraception postpartum BTL, scheduled for today.   Bottle feeding.  Desires female circumcision.  Will be performed as outpatient.  Gestational thrombocytopenia noted, no intervention required.    LOS: 1 day   Debbie Padilla [facility]

## 2016-01-01 NOTE — Op Note (Signed)
Procedure(s): POST PARTUM TUBAL LIGATION Procedure Note  Debbie Padilla female 30 y.o. 01/01/2016  Indications: The patient is a 30 y.o. [id number] female PPD#1 s/p SVD desiring permanent sterilization.   Pre-operative Diagnosis: Multiparity, desiring permanent sterilization  Post-operative Diagnosis: Multiparity, desiring permanent sterilization  Surgeon: Hildred LaserAnika Cinthya Bors, MD  Assistants: OR Scrub Tech  Anesthesia: Spinal anesthesia  Findings: Uterus 1 fingerbreadth above level of the umbilicus.  Fallopian tubes and ovaries appeared normal.   Procedure Details: The patient was seen in the Holding Room. The risks, benefits, complications, treatment options, and expected outcomes were discussed with the patient.  The patient concurred with the proposed plan, giving informed consent.  The site of surgery properly noted/marked. The patient was taken to the Operating Room, identified as Debbie Padilla and the procedure verified as Procedure(s) (LRB): POST PARTUM TUBAL LIGATION (Bilateral).  The patient was taken to the operating room where she received spinal anesthesia without difficulty.  She was then placed in the dorsal supine position and prepped and draped in sterile fashion.  Testing of anesthesia was noted to be adequate. After an adequate timeout was performed, attention was turned to the patient's abdomen where a linear area of the skin underneath the umbilicus was injected with ~ 6 ml of 1% Sensorcaine.  A small transverse skin incision was made under the umbilical fold. The incision was taken down to the layer of fascia using the scalpel, and fascia was incised, and extended bilaterally using Mayo scissors. The peritoneum was entered in a sharp fashion. Attention was then turned to the patient's uterus, and right fallopian tube was identified and followed out to the fimbriated end.  The Babcock clamp was then used to grasp the tube approximately 4 cm from the cornual  region.  A 3 cm segment of tube was then ligated with a free tie of 0-Chromic using the Park[place] method and excised.  The left fallopian tube was then ligated in a similar fashion and excised. The tubal lumens were cauterized bilaterally.  Good hemostasis was noted with bilateral fallopian tubes.   The instruments were then removed from the patient's abdomen and the fascial incision was repaired with 0 Vicryl, and injected with 4 ml of 1% Sensorcaine.  Tthe skin was closed with a 4-0 Monocryl subcuticular stitch. The patient tolerated the procedure well.  Instrument, sponge, and needle counts were correct times two.  The patient was then taken to the recovery room awake and in stable condition.  Estimated Blood Loss:  minimal      Drains: None.  Patient voided prior to procedure.          Total IV Fluids:  600 ml of Lactated Ringer's  Specimens: Segments of bilateral fallopian tubes         Implants: None         Complications:  None; patient tolerated the procedure well.         Disposition: PACU - hemodynamically stable.         Condition: stable   Hildred LaserAnika Kaelen Caughlin, MD [facility]

## 2016-01-01 NOTE — Anesthesia Preprocedure Evaluation (Addendum)
Anesthesia Evaluation  Patient identified by MRN, date of birth, ID band Patient awake    Reviewed: Allergy & Precautions, NPO status , Patient's Chart, lab work & pertinent test results  Airway Mallampati: I  TM Distance: >3 FB Neck ROM: Full    Dental no notable dental hx.    Pulmonary neg COPD, Current Smoker,    breath sounds clear to auscultation- rhonchi (-) wheezing      Cardiovascular Exercise Tolerance: Good (-) hypertension(-) CAD and (-) Past MI  Rhythm:Regular Rate:Normal - Systolic murmurs and - Diastolic murmurs    Neuro/Psych Anxiety Depression Bipolar Disorder negative neurological ROS     GI/Hepatic negative GI ROS, Neg liver ROS,   Endo/Other  negative endocrine ROSneg diabetes  Renal/GU negative Renal ROS     Musculoskeletal   Abdominal (+) - obese,   Peds  Hematology Gestational thrombocytopenia    Anesthesia Other Findings PPD #1  Reproductive/Obstetrics                            Anesthesia Physical Anesthesia Plan  ASA: II  Anesthesia Plan: Spinal and MAC   Post-op Pain Management:    Induction:   Airway Management Planned:   Additional Equipment:   Intra-op Plan:   Post-operative Plan:   Informed Consent: I have reviewed the patients History and Physical, chart, labs and discussed the procedure including the risks, benefits and alternatives for the proposed anesthesia with the patient or authorized representative who has indicated his/her understanding and acceptance.     Plan Discussed with: CRNA and Anesthesiologist  Anesthesia Plan Comments:        Lab Results  Component Value Date   WBC 16.0 (H) 01/01/2016   HGB 10.0 (L) 01/01/2016   HCT 29.0 (L) 01/01/2016   MCV 84.6 01/01/2016   PLT 118 (L) 01/01/2016    Anesthesia Quick Evaluation

## 2016-01-02 ENCOUNTER — Encounter: Payer: Self-pay | Admitting: Obstetrics and Gynecology

## 2016-01-02 MED ORDER — DOCUSATE SODIUM 100 MG PO CAPS: 100.0000 mg | ORAL_CAPSULE | Freq: Two times a day (BID) | ORAL | 2 refills | Status: DC | PRN

## 2016-01-02 MED ORDER — HYDROCODONE-ACETAMINOPHEN 5-325 MG PO TABS: 1.0000 | ORAL_TABLET | Freq: Four times a day (QID) | ORAL | 0 refills | Status: DC | PRN

## 2016-01-02 MED ORDER — IBUPROFEN 600 MG PO TABS: 600.0000 mg | ORAL_TABLET | Freq: Four times a day (QID) | ORAL | 1 refills | Status: DC

## 2016-01-02 MED ORDER — MAGNESIUM HYDROXIDE 400 MG/5ML PO SUSP
15.0000 mL | Freq: Every day | ORAL | Status: DC | PRN
  Administered 2016-01-02: 15 mL via ORAL
  Filled 2016-01-02: qty 30

## 2016-01-02 NOTE — Progress Notes (Signed)
Post Partum Day # 2, s/p SVD, POD#1 s/p PP BTL  Subjective: up ad lib, voiding and tolerating PO.  Does report mild to moderate soreness in left abdomen with fundal checks.   Objective: Blood pressure 124/76, pulse 64, temperature 98 F (36.7 C), temperature source Oral, resp. rate 18, height 5\' 2"  (1.575 m), weight 167 lb (75.8 kg), last menstrual period 05/04/2015, SpO2 100 %, not currently breastfeeding.  Physical Exam:  General: alert and no distress  Lungs: clear to auscultation bilaterally Breasts: normal appearance, no masses or tenderness Heart: regular rate and rhythm, S1, S2 normal, no murmur, click, rub or gallop Pelvis: Lochia: appropriate, Uterine Fundus: firm.  Mild tenderness in left abdomen.  Extremities: DVT Evaluation: Negative Homan's sign. No cords or calf tenderness. No significant calf/ankle edema.   CBC Latest Ref Rng & Units 01/01/2016 12/31/2015 09/26/2015  WBC 3.6 - 11.0 K/uL 16.0(H) 13.8(H) 10.9(H)  Hemoglobin 12.0 - 16.0 g/dL 10.0(L) 11.5(L) -  Hematocrit 35.0 - 47.0 % 29.0(L) 33.4(L) 33.3(L)  Platelets 150 - 440 K/uL 118(L) 120(L) 183    Assessment/Plan: Discharge home, s/p Social Work consult and Contraception s/p postpartum BTL.   Bottle feeding. UDS + for marijuana.  Continued to encourage smoking cessation.  Continue Subutex Desires female circumcision.  Will be performed as outpatient.  Gestational thrombocytopenia noted, no intervention required.  Infant to remain in hospital for observation (sepsis screen and Newborn drug screen)   LOS: 2 days   Debbie LaserAnika Padilla Padilla [facility]

## 2016-01-02 NOTE — Discharge Summary (Signed)
Obstetric Discharge Summary Reason for Admission: onset of labor at 39.3 weeks Prenatal Procedures: ultrasound Intrapartum Procedures: spontaneous vaginal delivery Postpartum Procedures: P.P. tubal ligation Complications-Operative and Postpartum: none Hemoglobin  Date Value Ref Range Status  01/01/2016 10.0 (L) 12.0 - 16.0 g/dL Final   HGB  Date Value Ref Range Status  09/13/2014 11.6 (L) 12.0 - 16.0 g/dL Final   HCT  Date Value Ref Range Status  01/01/2016 29.0 (L) 35.0 - 47.0 % Final   Hematocrit  Date Value Ref Range Status  09/26/2015 33.3 (L) 34.0 - 46.6 % Final    Physical Exam:  General: alert and no distress Lochia: appropriate Uterine Fundus: firm Incision: healing well, no significant drainage, no dehiscence, no significant erythema DVT Evaluation: Negative Homan's sign. No cords or calf tenderness. No significant calf/ankle edema.  Discharge Diagnoses: Term Pregnancy-delivered and Multiparity desiring permanent sterilization  Discharge Information: Date: 01/02/2016 Activity: pelvic rest Diet: routine Medications: None, Ibuprofen, Colace and Vicodin Condition: stable Instructions: refer to practice specific booklet Discharge to: home Follow-up Information    Debbie LaserAnika Keiston Manley, MD Follow up in 6 week(s).   Specialties:  Obstetrics and Gynecology, Radiology Why:  Postpartum visit Contact information: [name]           Newborn Data: Live born female  Birth Weight: 6 lb 3.5 oz (2820 g) APGAR: 7, 9  Home with mother after 5 days observation for NAS and sepsis screen.  Debbie Lasernika Saige Padilla 01/02/2016, 8:57 AM

## 2016-01-02 NOTE — Discharge Instructions (Signed)
Follow up sooner with fever, problems breathing, pain not helped by medications, severe depression( more than just baby blues, wanting to hurt yourself or the baby), severe bleeding ( saturating more than one pad an hour or large palm sized clots), no heavy lifting , no driving while taking narcotics, no douches, intercourse, tampons or enemas for 6 weeks General Postpartum Discharge Instructions ° °Do not drink alcohol or take tranquilizers.  °Do not take medicine that has not been prescribed by your doctor.  °Take showers instead of baths until your doctor gives you permission to take baths.  °No sexual intercourse or placement of anything in the vagina for 6 weeks or as instructed by your doctor. °Only take prescription or over-the-counter medicines  for pain, discomfort, or fever as directed by your doctor. Take medicines (antibiotics) that kill germs if they are prescribed for you. °  °Call the office or go to the Emergency Room if:  °You feel sick to your stomach (nauseous).  °You start to throw up (vomit).  °You have trouble eating or drinking.  °You have an oral temperature above 101.  °You have constipation that is not helped by adjusting diet or increasing fluid intake. Pain medicines are a common cause of constipation.  °You have foul smelling vaginal discharge or odor.  °You have bleeding requiring changing more than 1 pad per hour. °You have any other concerns. ° °SEEK IMMEDIATE MEDICAL CARE IF:  °You have persistent dizziness.  °You have difficulty breathing or shortness of breath.  °You have an oral temperature above 102.5, not controlled by medicine.  ° ° ° ° °

## 2016-01-03 ENCOUNTER — Encounter: Payer: Medicaid Other | Admitting: Obstetrics and Gynecology

## 2016-01-03 NOTE — Discharge Summary (Signed)
Reviewed D/C instructions with patient including when to call the MD, f/u appointment instructions, physical restrictions, prescriptions, and postpartum care at home.  Addressed patient questions as needed.  Obtained a signed copy of the D/C instructions for the patient file and provided a signed copy to the patient.  Discharged patient home via wheelchair escorted by nursing staff.

## 2016-01-04 LAB — SURGICAL PATHOLOGY

## 2016-01-05 ENCOUNTER — Ambulatory Visit (INDEPENDENT_AMBULATORY_CARE_PROVIDER_SITE_OTHER): Payer: Medicaid Other | Admitting: Obstetrics and Gynecology

## 2016-01-05 ENCOUNTER — Encounter: Payer: Self-pay | Admitting: Obstetrics and Gynecology

## 2016-01-05 ENCOUNTER — Telehealth: Payer: Self-pay | Admitting: Obstetrics and Gynecology

## 2016-01-05 VITALS — BP 123/81 | HR 77 | Ht 62.0 in | Wt 148.3 lb

## 2016-01-05 DIAGNOSIS — K5909 Other constipation: Secondary | ICD-10-CM

## 2016-01-05 DIAGNOSIS — R1032 Left lower quadrant pain: Secondary | ICD-10-CM | POA: Diagnosis not present

## 2016-01-05 NOTE — Telephone Encounter (Signed)
Called pt she states that she is having severe abdominal pain on the left side. Pt states that she has tried a heating pad, motrin, and Vicodin with no relief. Pt has also noticed that the left side appears to be swollen. Advised pt of the need to be evaluated. Pt to come in today at 1:30pm

## 2016-01-05 NOTE — Telephone Encounter (Signed)
Pt had baby this past Sat, bleeding more, left side pain. She called for an appt bur no place to put her

## 2016-01-06 NOTE — Progress Notes (Signed)
    GYNECOLOGY PROGRESS NOTE  Subjective:    Patient ID: Debbie Padilla, female    DOB: 11/29/1985, 30 y.o.   MRN: [id number]  HPI  Patient is a 30 y.o. [id number] female who is PPD#5 s/p SVD, POD#4 s/p PP BTL who presents for complaints of middle and abdominal pain (mostly left-sided).  Pain has been ongoing since immediately after her vaginal delivery almost 1 week ago, however worsened yesterday.  Pain is described as tenderness to touch, intermittent sharp sensation.  Does not radiate.  Denies fevers, chills, nausea, or vomiting. Pain no longer relieved with pain meds (Vicodin and Ibuprofen). Denies pain or issues with umbilical incision site.   The following portions of the patient's history were reviewed and updated as appropriate: allergies, current medications, past family history, past medical history, past social history, past surgical history and problem list.  Review of Systems A comprehensive review of systems was negative except for: Gastrointestinal: positive for abdominal pain and constipation (states she has not had a BM in over 1 week).  Objective:   Blood pressure 123/81, pulse 77, height 5\' 2"  (1.575 m), weight 148 lb 4.8 oz (67.3 kg), last menstrual period 05/04/2015, not currently breastfeeding. General appearance: alert and no distress Abdomen: normal findings: bowel sounds normal, no masses palpable and fundus firm, 3 cm below umbilicus.  and abnormal findings:  mild to moderate tenderness in the LLQ and up to level of umbilicus.   Possible herniation in left abdominal wall (however patient would not cough profoundly due to discomfort).  Pelvic: deferred Extremities: extremities normal, atraumatic, no cyanosis or edema Neurologic: Grossly normal   Assessment:   Constipation Possible ventral hernia  Plan:   - Discussion had with patient regarding constipation.  Received stool softeners and milk of magnesium in the hospital prior to discharge but notes no BM  occurred.  Advised on magnesium citrate or Fleet's enema.  If pain does not resolve after BM, will consider referral to General Surgery to assess again for possible hernia.   - To f/u sooner if symptoms worsen.

## 2016-04-10 ENCOUNTER — Other Ambulatory Visit: Payer: Self-pay

## 2016-04-10 DIAGNOSIS — R52 Pain, unspecified: Secondary | ICD-10-CM

## 2016-04-10 MED ORDER — IBUPROFEN 600 MG PO TABS: 600.0000 mg | ORAL_TABLET | Freq: Four times a day (QID) | ORAL | 1 refills | Status: DC

## 2016-05-08 ENCOUNTER — Emergency Department
Admission: EM | Admit: 2016-05-08 | Discharge: 2016-05-09 | Disposition: A | Discharge status: Home / Self Care | Payer: Medicaid Other | Attending: Emergency Medicine | Admitting: Emergency Medicine

## 2016-05-08 ENCOUNTER — Encounter: Payer: Self-pay | Admitting: Emergency Medicine

## 2016-05-08 DIAGNOSIS — R51 Headache: Secondary | ICD-10-CM | POA: Insufficient documentation

## 2016-05-08 DIAGNOSIS — R519 Headache, unspecified: Secondary | ICD-10-CM

## 2016-05-08 DIAGNOSIS — F17210 Nicotine dependence, cigarettes, uncomplicated: Secondary | ICD-10-CM | POA: Insufficient documentation

## 2016-05-08 MED ORDER — METOCLOPRAMIDE HCL 5 MG/ML IJ SOLN
INTRAMUSCULAR | Status: AC
  Filled 2016-05-08: qty 2

## 2016-05-08 MED ORDER — KETOROLAC TROMETHAMINE 60 MG/2ML IM SOLN
60.0000 mg | Freq: Once | INTRAMUSCULAR | Status: AC
  Administered 2016-05-08: 60 mg via INTRAMUSCULAR
  Filled 2016-05-08: qty 2

## 2016-05-08 MED ORDER — KETOROLAC TROMETHAMINE 60 MG/2ML IM SOLN
INTRAMUSCULAR | Status: AC
  Administered 2016-05-08: 60 mg via INTRAMUSCULAR
  Filled 2016-05-08: qty 2

## 2016-05-08 NOTE — ED Provider Notes (Signed)
[facility] Emergency Department Provider Note   First MD Initiated Contact with Patient 05/08/16 2338     (approximate)  I have reviewed the triage vital signs and the nursing notes.   HISTORY  Chief Complaint Headache   HPI Debbie Padilla is a 30 y.o. female Debbie Padilla with intermittent headache 2 weeks with no associated symptoms. Patient denies any weakness no numbness gait instability visual changes nausea or vomiting. Patient states that she consider headaches may be secondary to stress that she is working from 5 AM till midnight daily. Patient also believes that it may be is in today due to decrease caffeine intake. Patient states that she usually drinks red bull multiple times per day and has not had any today.   Past Medical History:  Diagnosis Date  . Anxiety   . Bipolar disorder (HCC)   . Depression   . Preterm labor     Patient Active Problem List   Diagnosis Date Noted  . Gestational thrombocytopenia (HCC) 01/01/2016  . Abdominal pain 12/31/2015  . Active labor at term 12/31/2015  . Positive GBS test 12/27/2015  . Bipolar disorder (HCC) 12/07/2015  . H/O premature rupture of membranes in previous pregnancy, currently pregnant 11/10/2015  . H/O preterm delivery, currently pregnant 11/10/2015  . Prenatal care insufficient 11/10/2015  . Short interval between pregnancies affecting pregnancy in third trimester, antepartum 11/10/2015  . Abnormal drug screen 09/27/2015  . Tobacco abuse 10/06/2014    Past Surgical History:  Procedure Laterality Date  . ANKLE INCISION AND DRAINAGE Left July 2012  . DILATION AND CURETTAGE OF UTERUS    . TUBAL LIGATION Bilateral 01/01/2016   Procedure: POST PARTUM TUBAL LIGATION;  Surgeon: Hildred LaserAnika Cherry, MD;  Location: [facility] ORS;  Service: Gynecology;  Laterality: Bilateral;    Prior to Admission medications   Medication Sig Start Date End Date Taking? Authorizing Provider  buprenorphine (SUBUTEX) 8 MG  SUBL SL tablet Place 8 mg under the tongue daily.    Historical Provider, MD  docusate sodium (COLACE) 100 MG capsule Take 1 capsule (100 mg total) by mouth 2 (two) times daily as needed. 01/02/16   Hildred LaserAnika Cherry, MD  HYDROcodone-acetaminophen (NORCO/VICODIN) 5-325 MG tablet Take 1 tablet by mouth every 6 (six) hours as needed. 01/02/16   Hildred LaserAnika Cherry, MD  ibuprofen (ADVIL,MOTRIN) 600 MG tablet Take 1 tablet (600 mg total) by mouth every 6 (six) hours. 04/10/16   Hildred LaserAnika Cherry, MD    Allergies Patient has no known allergies.  Family History  Problem Relation Age of Onset  . Asthma Daughter   . Asthma Paternal Grandmother   . Diabetes Paternal Grandmother   . Cancer Neg Hx   . Heart disease Neg Hx     Social History Social History  Substance Use Topics  . Smoking status: Current Every Day Smoker    Packs/day: 0.25    Years: 11.00    Types: Cigarettes  . Smokeless tobacco: Never Used  . Alcohol use No    Review of Systems Constitutional: No fever/chills Eyes: No visual changes. ENT: No sore throat. Cardiovascular: Denies chest pain. Respiratory: Denies shortness of breath. Gastrointestinal: No abdominal pain.  No nausea, no vomiting.  No diarrhea.  No constipation. Genitourinary: Negative for dysuria. Musculoskeletal: Negative for back pain. Skin: Negative for rash. Neurological: Positive for for headaches,Negative focal weakness or numbness.  10-point ROS otherwise negative.  ____________________________________________   PHYSICAL EXAM:  VITAL SIGNS: ED Triage Vitals  Enc Vitals Group  BP 05/08/16 2314 138/79     Pulse Rate 05/08/16 2314 88     Resp 05/08/16 2314 18     Temp 05/08/16 2314 97.9 F (36.6 C)     Temp Source 05/08/16 2314 Oral     SpO2 05/08/16 2314 100 %     Weight 05/08/16 2311 168 lb (76.2 kg)     Height 05/08/16 2311 5\' 2"  (1.575 m)     Head Circumference --      Peak Flow --      Pain Score 05/08/16 2310 7     Pain Loc --      Pain Edu?  --      Excl. in GC? --     Constitutional: Alert and oriented. Well appearing and in no acute distress. Eyes: Conjunctivae are normal. PERRL. EOMI. Head: Atraumatic. Mouth/Throat: Mucous membranes are moist.  Oropharynx non-erythematous. Neck: No stridor.   Cardiovascular: Normal rate, regular rhythm. Good peripheral circulation. Grossly normal heart sounds. Respiratory: Normal respiratory effort.  No retractions. Lungs CTAB. Gastrointestinal: Soft and nontender. No distention.  Musculoskeletal: No lower extremity tenderness nor edema. No gross deformities of extremities. Neurologic:  Normal speech and language. No gross focal neurologic deficits are appreciated.  Skin:  Skin is warm, dry and intact. No rash noted. Psychiatric: Mood and affect are normal. Speech and behavior are normal.   Procedures   __   INITIAL IMPRESSION / ASSESSMENT AND PLAN / ED COURSE  Pertinent labs & imaging results that were available during my care of the patient were reviewed by me and considered in my medical decision making (see chart for details).  History physical exam consistent with possible caffeine withdrawal headache versus migraine. Patient has no focal neurological deficits and as such imaging of the brain was not performed. Patient given IM Toradol with resolution of pain.   Clinical Course     ____________________________________________  FINAL CLINICAL IMPRESSION(S) / ED DIAGNOSES  Final diagnoses:  Acute nonintractable headache, unspecified headache type     MEDICATIONS GIVEN DURING THIS VISIT:  Medications  metoCLOPramide (REGLAN) 5 MG/ML injection (not administered)  ketorolac (TORADOL) 60 MG/2ML injection (not administered)  ketorolac (TORADOL) injection 60 mg (not administered)     NEW OUTPATIENT MEDICATIONS STARTED DURING THIS VISIT:  New Prescriptions   No medications on file    Modified Medications   No medications on file    Discontinued Medications    No medications on file     Note:  This document was prepared using Dragon voice recognition software and may include unintentional dictation errors.    Darci Currentandolph N Dolly Harbach, MD 05/09/16 [phone]

## 2016-05-08 NOTE — ED Triage Notes (Signed)
Patient ambulatory to triage with steady gait, without difficulty or distress noted; pt reports left sided HA x 2weeks with no accomp symptoms; st hx of same

## 2016-05-09 NOTE — ED Notes (Addendum)
Pt. States mild intermittent headache x2 weeks, denies N/V/D, states not drinking caffeine today as she does every other day and concerned that is the cause for persistent, more intense headache today

## 2016-09-08 ENCOUNTER — Emergency Department
Admission: EM | Admit: 2016-09-08 | Discharge: 2016-09-08 | Disposition: A | Discharge status: Home / Self Care | Payer: Medicaid Other | Attending: Emergency Medicine | Admitting: Emergency Medicine

## 2016-09-08 ENCOUNTER — Encounter: Payer: Self-pay | Admitting: Medical Oncology

## 2016-09-08 DIAGNOSIS — R519 Headache, unspecified: Secondary | ICD-10-CM

## 2016-09-08 DIAGNOSIS — Z79899 Other long term (current) drug therapy: Secondary | ICD-10-CM | POA: Insufficient documentation

## 2016-09-08 DIAGNOSIS — F17210 Nicotine dependence, cigarettes, uncomplicated: Secondary | ICD-10-CM | POA: Insufficient documentation

## 2016-09-08 DIAGNOSIS — I10 Essential (primary) hypertension: Secondary | ICD-10-CM | POA: Insufficient documentation

## 2016-09-08 DIAGNOSIS — R42 Dizziness and giddiness: Secondary | ICD-10-CM | POA: Insufficient documentation

## 2016-09-08 DIAGNOSIS — R51 Headache: Secondary | ICD-10-CM | POA: Insufficient documentation

## 2016-09-08 HISTORY — DX: Essential (primary) hypertension: I10

## 2016-09-08 LAB — POCT PREGNANCY, URINE: PREG TEST UR: NEGATIVE

## 2016-09-08 MED ORDER — SODIUM CHLORIDE 0.9 % IV BOLUS (SEPSIS)
1000.0000 mL | Freq: Once | INTRAVENOUS | Status: AC
  Administered 2016-09-08: 1000 mL via INTRAVENOUS

## 2016-09-08 MED ORDER — KETOROLAC TROMETHAMINE 30 MG/ML IJ SOLN
30.0000 mg | Freq: Once | INTRAMUSCULAR | Status: AC
  Administered 2016-09-08: 30 mg via INTRAVENOUS
  Filled 2016-09-08: qty 1

## 2016-09-08 NOTE — ED Provider Notes (Signed)
[facility] Emergency Department Provider Note  ____________________________________________   I have reviewed the triage vital signs and the nursing notes.   HISTORY  Chief Complaint Headache and Dizziness   History limited by: Not Limited   HPI Debbie Padilla is a 31 y.o. female who presents to the emergency department today he has of concerns for episode of dizziness that occurred while she was driving to work today. She describes feeling of being lightheaded and seen white thoughts. By the time my examination this has improved. In addition she states she has had headache for the past 3 days. She has been trying to come off of caffeine. She also has not had much sleep recently. Additionally she has had a lot of stress.   Past Medical History:  Diagnosis Date  . Anxiety   . Bipolar disorder (HCC)   . Depression   . Hypertension   . Preterm labor     Patient Active Problem List   Diagnosis Date Noted  . Gestational thrombocytopenia (HCC) 01/01/2016  . Abdominal pain 12/31/2015  . Active labor at term 12/31/2015  . Positive GBS test 12/27/2015  . Bipolar disorder (HCC) 12/07/2015  . H/O premature rupture of membranes in previous pregnancy, currently pregnant 11/10/2015  . H/O preterm delivery, currently pregnant 11/10/2015  . Prenatal care insufficient 11/10/2015  . Short interval between pregnancies affecting pregnancy in third trimester, antepartum 11/10/2015  . Abnormal drug screen 09/27/2015  . Tobacco abuse 10/06/2014    Past Surgical History:  Procedure Laterality Date  . ANKLE INCISION AND DRAINAGE Left July 2012  . DILATION AND CURETTAGE OF UTERUS    . TUBAL LIGATION Bilateral 01/01/2016   Procedure: POST PARTUM TUBAL LIGATION;  Surgeon: [name], MD;  Location: [facility] ORS;  Service: Gynecology;  Laterality: Bilateral;    Prior to Admission medications   Medication Sig Start Date End Date Taking? Authorizing Provider   buprenorphine (SUBUTEX) 8 MG SUBL SL tablet Place 8 mg under the tongue daily.    Historical Provider, MD  docusate sodium (COLACE) 100 MG capsule Take 1 capsule (100 mg total) by mouth 2 (two) times daily as needed. 01/02/16   [name], MD  HYDROcodone-acetaminophen (NORCO/VICODIN) 5-325 MG tablet Take 1 tablet by mouth every 6 (six) hours as needed. 01/02/16   [name], MD  ibuprofen (ADVIL,MOTRIN) 600 MG tablet Take 1 tablet (600 mg total) by mouth every 6 (six) hours. 04/10/16   [name], MD    Allergies Patient has no known allergies.  Family History  Problem Relation Age of Onset  . Asthma Daughter   . Asthma Paternal Grandmother   . Diabetes Paternal Grandmother   . Cancer Neg Hx   . Heart disease Neg Hx     Social History Social History  Substance Use Topics  . Smoking status: Current Every Day Smoker    Packs/day: 0.25    Years: 11.00    Types: Cigarettes  . Smokeless tobacco: Never Used  . Alcohol use No    Review of Systems  Constitutional: Negative for fever. Cardiovascular: Negative for chest pain. Respiratory: Negative for shortness of breath. Gastrointestinal: Negative for abdominal pain, vomiting and diarrhea. Genitourinary: Negative for dysuria. Musculoskeletal: Negative for back pain. Skin: Negative for rash. Neurological: Positive for headache.  10-point ROS otherwise negative.  ____________________________________________   PHYSICAL EXAM:  VITAL SIGNS: ED Triage Vitals  Enc Vitals Group     BP 09/08/16 1050 132/88     Pulse Rate 09/08/16  1050 83     Resp 09/08/16 1050 18     Temp 09/08/16 1050 98.3 F (36.8 C)     Temp Source 09/08/16 1050 Oral     SpO2 09/08/16 1050 99 %     Weight 09/08/16 1048 168 lb (76.2 kg)     Height --      Head Circumference --      Peak Flow --      Pain Score 09/08/16 1047 7    Constitutional: Alert and oriented. Well appearing and in no distress. Eyes: Conjunctivae are normal. Normal  extraocular movements. ENT   Head: Normocephalic and atraumatic.   Nose: No congestion/rhinnorhea.   Mouth/Throat: Mucous membranes are moist.   Neck: No stridor. Hematological/Lymphatic/Immunilogical: No cervical lymphadenopathy. Cardiovascular: Normal rate, regular rhythm.  No murmurs, rubs, or gallops. Respiratory: Normal respiratory effort without tachypnea nor retractions. Breath sounds are clear and equal bilaterally. No wheezes/rales/rhonchi. Gastrointestinal: Soft and non tender. No rebound. No guarding.  Genitourinary: Deferred Musculoskeletal: Normal range of motion in all extremities. No lower extremity edema. Neurologic:  Normal speech and language. No gross focal neurologic deficits are appreciated.  Skin:  Skin is warm, dry and intact. No rash noted. Psychiatric: Mood and affect are normal. Speech and behavior are normal. Patient exhibits appropriate insight and judgment.  ____________________________________________    LABS (pertinent positives/negatives)  Labs Reviewed  POCT PREGNANCY, URINE     ____________________________________________   EKG  None  ____________________________________________    RADIOLOGY  None  ____________________________________________   PROCEDURES  Procedures  ____________________________________________   INITIAL IMPRESSION / ASSESSMENT AND PLAN / ED COURSE  Pertinent labs & imaging results that were available during my care of the patient were reviewed by me and considered in my medical decision making (see chart for details).  Patient presented to the emergency department today after an episode of lightheadedness, dizziness and a headache. Concerning findings or physical exam. Patient did feel better after fluids and Toradol. We will discharge and give primary care follow-up.  ____________________________________________   FINAL CLINICAL IMPRESSION(S) / ED DIAGNOSES  Final diagnoses:  Dizziness   Nonintractable headache, unspecified chronicity pattern, unspecified headache type     Note: This dictation was prepared with Dragon dictation. Any transcriptional errors that result from this process are unintentional     Phineas Semen, MD 09/08/16 1448

## 2016-09-08 NOTE — Discharge Instructions (Signed)
Please seek medical attention for any high fevers, chest pain, shortness of breath, change in behavior, persistent vomiting, bloody stool or any other new or concerning symptoms.  

## 2016-09-08 NOTE — ED Triage Notes (Signed)
Pt reports that she has had a headache for a couple of days with intermittent dizziness.

## 2017-01-08 ENCOUNTER — Encounter: Payer: Self-pay | Admitting: *Deleted

## 2017-01-08 ENCOUNTER — Emergency Department
Admission: EM | Admit: 2017-01-08 | Discharge: 2017-01-08 | Disposition: A | Discharge status: Home / Self Care | Payer: Medicaid Other | Attending: Emergency Medicine | Admitting: Emergency Medicine

## 2017-01-08 DIAGNOSIS — F17210 Nicotine dependence, cigarettes, uncomplicated: Secondary | ICD-10-CM | POA: Insufficient documentation

## 2017-01-08 DIAGNOSIS — N390 Urinary tract infection, site not specified: Secondary | ICD-10-CM | POA: Insufficient documentation

## 2017-01-08 DIAGNOSIS — I10 Essential (primary) hypertension: Secondary | ICD-10-CM | POA: Insufficient documentation

## 2017-01-08 DIAGNOSIS — R300 Dysuria: Secondary | ICD-10-CM | POA: Insufficient documentation

## 2017-01-08 DIAGNOSIS — M545 Low back pain: Secondary | ICD-10-CM | POA: Insufficient documentation

## 2017-01-08 LAB — URINALYSIS, COMPLETE (UACMP) WITH MICROSCOPIC
BILIRUBIN URINE: NEGATIVE
Glucose, UA: NEGATIVE mg/dL
HGB URINE DIPSTICK: NEGATIVE
Ketones, ur: NEGATIVE mg/dL
NITRITE: NEGATIVE
PH: 8 (ref 5.0–8.0)
Protein, ur: 30 mg/dL — AB
SPECIFIC GRAVITY, URINE: 1.025 (ref 1.005–1.030)

## 2017-01-08 MED ORDER — HYDROCODONE-ACETAMINOPHEN 5-325 MG PO TABS: 1.0000 | ORAL_TABLET | Freq: Four times a day (QID) | ORAL | 0 refills | Status: DC | PRN

## 2017-01-08 MED ORDER — SULFAMETHOXAZOLE-TRIMETHOPRIM 800-160 MG PO TABS: 1.0000 | ORAL_TABLET | Freq: Two times a day (BID) | ORAL | 0 refills | Status: DC

## 2017-01-08 NOTE — ED Provider Notes (Signed)
[facility] Emergency Department Provider Note  ____________________________________________   First MD Initiated Contact with Patient 01/08/17 1222     (approximate)  I have reviewed the triage vital signs and the nursing notes.   HISTORY  Chief Complaint Back Pain    HPI Debbie Padilla is a 31 y.o. female is here with complaint of low back pain for several days. Patient states that her pain radiates across her back but she has no paresthesias or radiation into her lower extremities. She denies any injury to her back. She also has been experiencing some dysuria. She denies any fever, chills, nausea or vomiting. Patient has not taken any over-the-counter medication for back pain. She rates her pain as a 7 out of 10.   Past Medical History:  Diagnosis Date  . Anxiety   . Bipolar disorder (HCC)   . Depression   . Hypertension   . Preterm labor     Patient Active Problem List   Diagnosis Date Noted  . Gestational thrombocytopenia (HCC) 01/01/2016  . Abdominal pain 12/31/2015  . Active labor at term 12/31/2015  . Positive GBS test 12/27/2015  . Bipolar disorder (HCC) 12/07/2015  . H/O premature rupture of membranes in previous pregnancy, currently pregnant 11/10/2015  . H/O preterm delivery, currently pregnant 11/10/2015  . Prenatal care insufficient 11/10/2015  . Short interval between pregnancies affecting pregnancy in third trimester, antepartum 11/10/2015  . Abnormal drug screen 09/27/2015  . Tobacco abuse 10/06/2014    Past Surgical History:  Procedure Laterality Date  . ANKLE INCISION AND DRAINAGE Left July 2012  . DILATION AND CURETTAGE OF UTERUS    . TUBAL LIGATION Bilateral 01/01/2016   Procedure: POST PARTUM TUBAL LIGATION;  Surgeon: [name], MD;  Location: [facility] ORS;  Service: Gynecology;  Laterality: Bilateral;    Prior to Admission medications   Medication Sig Start Date End Date Taking? Authorizing Provider    buprenorphine (SUBUTEX) 8 MG SUBL SL tablet Place 8 mg under the tongue daily.    [provider]  docusate sodium (COLACE) 100 MG capsule Take 1 capsule (100 mg total) by mouth 2 (two) times daily as needed. 01/02/16   [name], MD  HYDROcodone-acetaminophen (NORCO/VICODIN) 5-325 MG tablet Take 1 tablet by mouth every 6 (six) hours as needed. 01/08/17   Tommi Rumps, PA-C  sulfamethoxazole-trimethoprim (BACTRIM DS,SEPTRA DS) 800-160 MG tablet Take 1 tablet by mouth 2 (two) times daily. 01/08/17   Tommi Rumps, PA-C    Allergies Patient has no known allergies.  Family History  Problem Relation Age of Onset  . Asthma Daughter   . Asthma Paternal Grandmother   . Diabetes Paternal Grandmother   . Cancer Neg Hx   . Heart disease Neg Hx     Social History Social History  Substance Use Topics  . Smoking status: Current Every Day Smoker    Packs/day: 0.25    Years: 11.00    Types: Cigarettes  . Smokeless tobacco: Never Used  . Alcohol use No    Review of Systems Constitutional: No fever/chills Cardiovascular: Denies chest pain. Respiratory: Denies shortness of breath. Gastrointestinal: No abdominal pain.  No nausea, no vomiting.  Genitourinary: Positive for dysuria. Musculoskeletal: Positive for back pain. Skin: Negative for rash. Neurological: Negative for focal weakness or numbness. ___________________________________________   PHYSICAL EXAM:  VITAL SIGNS: ED Triage Vitals  Enc Vitals Group     BP 01/08/17 1149 125/64     Pulse Rate 01/08/17 1149 70  Resp 01/08/17 1149 18     Temp 01/08/17 1149 98.4 F (36.9 C)     Temp Source 01/08/17 1149 Oral     SpO2 01/08/17 1149 100 %     Weight 01/08/17 1143 182 lb (82.6 kg)     Height 01/08/17 1143 5\' 2"  (1.575 m)     Head Circumference --      Peak Flow --      Pain Score 01/08/17 1143 7     Pain Loc --      Pain Edu? --      Excl. in GC? --    Constitutional: Alert and oriented. Well  appearing and in no acute distress. Eyes: Conjunctivae are normal. PERRL. EOMI. Head: Atraumatic. Neck: No stridor.   Cardiovascular: Normal rate, regular rhythm. Grossly normal heart sounds.  Good peripheral circulation. Respiratory: Normal respiratory effort.  No retractions. Lungs CTAB. Gastrointestinal: Soft and nontender. No distention.  No CVA tenderness. Musculoskeletal: On examination the back there is no gross deformity and no point tenderness on palpation of thoracic and lumbar spine. Range of motion is without restriction or muscle spasms. Good muscle strength and normal gait was noted. Neurologic:  Normal speech and language. No gross focal neurologic deficits are appreciated. No gait instability. Skin:  Skin is warm, dry and intact. No rash noted. Psychiatric: Mood and affect are normal. Speech and behavior are normal.  ____________________________________________   LABS (all labs ordered are listed, but only abnormal results are displayed)  Labs Reviewed  URINALYSIS, COMPLETE (UACMP) WITH MICROSCOPIC - Abnormal; Notable for the following:       Result Value   Color, Urine YELLOW (*)    APPearance HAZY (*)    Protein, ur 30 (*)    Leukocytes, UA TRACE (*)    Bacteria, UA RARE (*)    Squamous Epithelial / LPF 6-30 (*)    All other components within normal limits  URINE CULTURE  POC URINE PREG, ED   _  PROCEDURES  Procedure(s) performed: None  Procedures  Critical Care performed: No  ____________________________________________   INITIAL IMPRESSION / ASSESSMENT AND PLAN / ED COURSE  Pertinent labs & imaging results that were available during my care of the patient were reviewed by me and considered in my medical decision making (see chart for details).  Patient was given results of her urinalysis and given a prescription for Bactrim DS twice a day for 10 days. She is to increase fluids. She is to follow-up with [facility] if any  continued problems since she does not have a PCP. Patient was also given a prescription for Norco for pain as needed. She is aware that she cannot drive or operate machinery while taking this medication.   ___________________________________________   FINAL CLINICAL IMPRESSION(S) / ED DIAGNOSES  Final diagnoses:  Acute urinary tract infection      NEW MEDICATIONS STARTED DURING THIS VISIT:  Discharge Medication List as of 01/08/2017  1:54 PM    START taking these medications   Details  sulfamethoxazole-trimethoprim (BACTRIM DS,SEPTRA DS) 800-160 MG tablet Take 1 tablet by mouth 2 (two) times daily., Starting Tue 01/08/2017, Print         Note:  This document was prepared using Dragon voice recognition software and may include unintentional dictation errors.    Tommi Rumps, PA-C 01/08/17 1619    Merrily Brittle, MD 01/09/17 [phone]

## 2017-01-08 NOTE — Discharge Instructions (Signed)
Follow-up with the health department or [facility] acute-care if any continued problems. Increase fluids. Begin taking antibiotics twice a day for the next 10 days. Norco if needed for pain as directed. Do not take pain medication while driving or operating machinery.

## 2017-01-08 NOTE — ED Notes (Signed)
Bedside poct preg test negative

## 2017-01-08 NOTE — ED Triage Notes (Signed)
Pt to ed with c/o back pain for several days.  Reports pain across entire back, denies new injury.

## 2017-01-11 LAB — URINE CULTURE
Culture: >=100000 — AB
Special Requests: NORMAL

## 2017-04-16 ENCOUNTER — Emergency Department
Admission: EM | Admit: 2017-04-16 | Discharge: 2017-04-16 | Disposition: A | Discharge status: Home / Self Care | Payer: Self-pay | Attending: Emergency Medicine | Admitting: Emergency Medicine

## 2017-04-16 ENCOUNTER — Other Ambulatory Visit: Payer: Self-pay

## 2017-04-16 ENCOUNTER — Encounter: Payer: Self-pay | Admitting: Emergency Medicine

## 2017-04-16 DIAGNOSIS — F17210 Nicotine dependence, cigarettes, uncomplicated: Secondary | ICD-10-CM | POA: Insufficient documentation

## 2017-04-16 DIAGNOSIS — I10 Essential (primary) hypertension: Secondary | ICD-10-CM | POA: Insufficient documentation

## 2017-04-16 DIAGNOSIS — K029 Dental caries, unspecified: Secondary | ICD-10-CM | POA: Insufficient documentation

## 2017-04-16 MED ORDER — IBUPROFEN 600 MG PO TABS: 600.0000 mg | ORAL_TABLET | Freq: Four times a day (QID) | ORAL | 0 refills | Status: DC | PRN

## 2017-04-16 MED ORDER — TRAMADOL HCL 50 MG PO TABS: 50.0000 mg | ORAL_TABLET | Freq: Four times a day (QID) | ORAL | 0 refills | Status: DC | PRN

## 2017-04-16 MED ORDER — AMOXICILLIN 500 MG PO CAPS: 500.0000 mg | ORAL_CAPSULE | Freq: Three times a day (TID) | ORAL | 0 refills | Status: DC

## 2017-04-16 NOTE — ED Notes (Signed)
See triage note  States she has a broken tooth on the left lower   Developed increased pain during the night  States pain radiates into left side of face

## 2017-04-16 NOTE — Discharge Instructions (Signed)
OPTIONS FOR DENTAL FOLLOW UP CARE ° °[place] Department of Health and Human Services - Local Safety Net Dental Clinics °http://www.ncdhhs.gov/dph/oralhealth/services/safetynetclinics.htm °  °[facility] (336-562-3123) ° °[facility] [place] (919-933-9087) ° °[facility] (919-663-1744 ext [phone]) ° °[facility] ([phone]) ° °[facility] ([phone]) °This clinic caters to the indigent population and is on a lottery system. °Location: °[facility], [address], [place] °Clinic Hours: °Wednesdays from 6pm - 9pm, patients seen by a lottery system. °For dates, call or go to www.med.[facility].edu/[facility]ac/patients/Dental-[facility] °Services: °Cleanings, fillings and simple extractions. °Payment Options: °DENTAL WORK IS FREE OF CHARGE. Bring proof of income or support. °Best way to get seen: °Arrive at 5:15 pm - this is a lottery, NOT first come/first serve, so arriving earlier will not increase your chances of being seen. °  °  °[facility] °[phone] °Select option 1 for emergencies °  °Location: °[facility], [address], [place] °Clinic Hours: °No walk-ins accepted - call the day before to schedule an appointment. °Check in times are 9:30 am and 1:30 pm. °Services: °Simple extractions, temporary fillings, pulpectomy/pulp debridement, uncomplicated abscess drainage. °Payment Options: °PAYMENT IS DUE AT THE TIME OF SERVICE.  Fee is usually $100-200, additional surgical procedures (e.g. abscess drainage) may be extra. °Cash, checks, Visa/MasterCard accepted.  Can file Medicaid if patient is covered for dental - patient should call case worker to check. °No discount for [facility] patients. °Best way to get seen: °MUST call the day before and get onto the schedule. Can usually be seen the next 1-2 days. No walk-ins accepted. °  °  °[facility] °[phone] °   °Location: °[facility], [address], [place] °Clinic Hours: °M, W, Th, F 8am or 1:30pm, Tues 9a or 1:30 - first come/first served. °Services: °Simple extractions, temporary fillings, uncomplicated abscess drainage.  You do not need to be an [place] resident. °Payment Options: °PAYMENT IS DUE AT THE TIME OF SERVICE. °Dental insurance, otherwise sliding scale - bring proof of income or support. °Depending on income and treatment needed, cost is usually $50-200. °Best way to get seen: °Arrive early as it is first come/first served. °  °  °[facility] °[phone] °  °Location: °[address] °Clinic Hours: °Mon-Thu 8a-5p °Services: °Most basic dental services including extractions and fillings. °Payment Options: °PAYMENT IS DUE AT THE TIME OF SERVICE. °Sliding scale, up to 50% off - bring proof if income or support. °Medicaid with dental option accepted. °Best way to get seen: °Call to schedule an appointment, can usually be seen within 2 weeks OR they will try to see walk-ins - show up at 8a or 2p (you may have to wait). °  °  °[facility] °919-245-2435 °[place] RESIDENTS ONLY °  °Location: °[facility], [address], [place], [place] [address] °Clinic Hours: By appointment only. °Monday - Thursday 8am-5pm, Friday 8am-12pm °Services: Cleanings, fillings, extractions. °Payment Options: °PAYMENT IS DUE AT THE TIME OF SERVICE. °Cash, Visa or MasterCard. Sliding scale - $30 minimum per service. °Best way to get seen: °Come in to office, complete packet and make an appointment - need proof of income °or support monies for each household member and proof of [place] residence. °Usually takes about a month to get in. °  °  °[facility] °[phone] °  °Location: °[address]   [place] °Clinic Hours: Walk-in Urgent Care Dental Services are offered Monday-Friday  mornings only. °The numbers of emergencies accepted daily is limited to the number of °providers available. °Maximum 15 - Mondays, Wednesdays & Thursdays °Maximum 10 - Tuesdays & Fridays °Services: °You do not need to be a  [place] resident to be seen for a dental emergency. °Emergencies are defined as pain, swelling, abnormal bleeding, or dental trauma. Walkins will receive x-rays if needed. °NOTE: Dental cleaning is not an emergency. °Payment Options: °PAYMENT IS DUE AT THE TIME OF SERVICE. °Minimum co-pay is $40.00 for uninsured patients. °Minimum co-pay is $3.00 for Medicaid with dental coverage. °Dental Insurance is accepted and must be presented at time of visit. °Medicare does not cover dental. °Forms of payment: Cash, credit card, checks. °Best way to get seen: °If not previously registered with the clinic, walk-in dental registration begins at 7:15 am and is on a first come/first serve basis. °If previously registered with the clinic, call to make an appointment. °  °  °[facility] °919-776-4359 °[place] RESIDENTS ONLY °  °Location: °[address], [place], [place] °Clinic Hours: °Mon-Thu 10a-2p °Services: Extractions only! °Payment Options: °FREE (donations accepted) - bring proof of income or support °Best way to get seen: °Call and schedule an appointment OR come at 8am on the 1st Monday of every month (except for holidays) when it is first come/first served. °  °  °[facility] °[phone] °  °Location: °[address], [place] °Clinic Hours: °Friday mornings °Services, Payment Options, Best way to get seen: °Call for info °

## 2017-04-16 NOTE — ED Triage Notes (Signed)
Pt presents with dental pain.

## 2017-04-16 NOTE — ED Provider Notes (Signed)
[facility] Emergency Department Provider Note  ____________________________________________   First MD Initiated Contact with Patient 04/16/17 0745     (approximate)  I have reviewed the triage vital signs and the nursing notes.   HISTORY  Chief Complaint Dental Problem    HPI Debbie Padilla is a 31 y.o. female complains of left-sided facial pain due to dental caries, states the cavity is been there for quite a while, has not followed up with a dentist because she does not have dental insurance, her dental insurance will start January, denies fever, chills, chest pain, shortness of breath   Past Medical History:  Diagnosis Date  . Anxiety   . Bipolar disorder (HCC)   . Depression   . Hypertension   . Preterm labor     Patient Active Problem List   Diagnosis Date Noted  . Gestational thrombocytopenia (HCC) 01/01/2016  . Abdominal pain 12/31/2015  . Active labor at term 12/31/2015  . Positive GBS test 12/27/2015  . Bipolar disorder (HCC) 12/07/2015  . H/O premature rupture of membranes in previous pregnancy, currently pregnant 11/10/2015  . H/O preterm delivery, currently pregnant 11/10/2015  . Prenatal care insufficient 11/10/2015  . Short interval between pregnancies affecting pregnancy in third trimester, antepartum 11/10/2015  . Abnormal drug screen 09/27/2015  . Tobacco abuse 10/06/2014    Past Surgical History:  Procedure Laterality Date  . ANKLE INCISION AND DRAINAGE Left July 2012  . DILATION AND CURETTAGE OF UTERUS    . TUBAL LIGATION Bilateral 01/01/2016   Procedure: POST PARTUM TUBAL LIGATION;  Surgeon: Hildred LaserAnika Cherry, MD;  Location: [facility] ORS;  Service: Gynecology;  Laterality: Bilateral;    Prior to Admission medications   Medication Sig Start Date End Date Taking? Authorizing Provider  amoxicillin (AMOXIL) 500 MG capsule Take 1 capsule (500 mg total) by mouth 3 (three) times daily. 04/16/17   Katasha Riga, Roselyn BeringSusan W, PA-C    ibuprofen (ADVIL,MOTRIN) 600 MG tablet Take 1 tablet (600 mg total) by mouth every 6 (six) hours as needed. 04/16/17   Sherrie MustacheFisher, Roselyn BeringSusan W, PA-C  traMADol (ULTRAM) 50 MG tablet Take 1 tablet (50 mg total) by mouth every 6 (six) hours as needed. 04/16/17   Faythe GheeFisher, Zandrea Kenealy W, PA-C    Allergies Patient has no known allergies.  Family History  Problem Relation Age of Onset  . Asthma Daughter   . Asthma Paternal Grandmother   . Diabetes Paternal Grandmother   . Cancer Neg Hx   . Heart disease Neg Hx     Social History Social History   Tobacco Use  . Smoking status: Current Every Day Smoker    Packs/day: 0.25    Years: 11.00    Pack years: 2.75    Types: Cigarettes  . Smokeless tobacco: Never Used  Substance Use Topics  . Alcohol use: No  . Drug use: Yes    Types: Marijuana    Comment: States was taking it for appetite but has stopped since 1 month    Review of Systems  Constitutional: No fever/chills Eyes: No visual changes. ENT: No sore throat. Positive dental pain Respiratory: Denies cough Genitourinary: Negative for dysuria. Musculoskeletal: Negative for back pain. Skin: Negative for rash.    ____________________________________________   PHYSICAL EXAM:  VITAL SIGNS: ED Triage Vitals [04/16/17 0734]  Enc Vitals Group     BP 132/77     Pulse Rate 79     Resp 18     Temp 98.5 F (36.9 C)  Temp Source Oral     SpO2 100 %     Weight 157 lb (71.2 kg)     Height 5\' 2"  (1.575 m)     Head Circumference      Peak Flow      Pain Score      Pain Loc      Pain Edu?      Excl. in GC?     Constitutional: Alert and oriented. Well appearing and in no acute distress. Eyes: Conjunctivae are normal.  Head: Atraumatic. Nose: No congestion/rhinnorhea. Mouth/Throat: Mucous membranes are moist.  Left lower molar is severely decayed,, swollen surrounding the tooth, the mandible is nontender, small amount of lymphadenopathy on the left cervical chain Cardiovascular:  Normal rate, regular rhythm. Respiratory: Normal respiratory effort.  No retractions GU: deferred Musculoskeletal: FROM all extremities, warm and well perfused Neurologic:  Normal speech and language.  Skin:  Skin is warm, dry and intact. No rash noted. Psychiatric: Mood and affect are normal. Speech and behavior are normal.  ____________________________________________   LABS (all labs ordered are listed, but only abnormal results are displayed)  Labs Reviewed - No data to display ____________________________________________   ____________________________________________  RADIOLOGY    ____________________________________________   PROCEDURES  Procedure(s) performed: No      ____________________________________________   INITIAL IMPRESSION / ASSESSMENT AND PLAN / ED COURSE  Pertinent labs & imaging results that were available during my care of the patient were reviewed by me and considered in my medical decision making (see chart for details).  Patient presents to the emergency room with dental pain, states he got severe last night, tooth has been decayed for quite a while, pain from the tooth is giving her migraine, on exam the patient's tooth is severely decayed to the gum is swollen, a prescription for amoxicillin 500 mg 3 times a day was given ibuprofen 600 mg 3 times a day, and tramadol 50 mg #10 with no refills      ____________________________________________   FINAL CLINICAL IMPRESSION(S) / ED DIAGNOSES  Final diagnoses:  Pain due to dental caries      NEW MEDICATIONS STARTED DURING THIS VISIT:  This SmartLink is deprecated. Use AVSMEDLIST instead to display the medication list for a patient.   Note:  This document was prepared using Dragon voice recognition software and may include unintentional dictation errors.    Faythe GheeFisher, Delma Villalva W, PA-C 04/16/17 [id number]    Jene EveryKinner, Robert, MD 04/16/17 [phone]

## 2017-07-02 ENCOUNTER — Encounter: Payer: Self-pay | Admitting: Emergency Medicine

## 2017-07-02 ENCOUNTER — Emergency Department
Admission: EM | Admit: 2017-07-02 | Discharge: 2017-07-02 | Disposition: A | Discharge status: Home / Self Care | Payer: Self-pay | Attending: Emergency Medicine | Admitting: Emergency Medicine

## 2017-07-02 ENCOUNTER — Other Ambulatory Visit: Payer: Self-pay

## 2017-07-02 DIAGNOSIS — H7292 Unspecified perforation of tympanic membrane, left ear: Secondary | ICD-10-CM | POA: Insufficient documentation

## 2017-07-02 DIAGNOSIS — G5602 Carpal tunnel syndrome, left upper limb: Secondary | ICD-10-CM | POA: Insufficient documentation

## 2017-07-02 DIAGNOSIS — F17210 Nicotine dependence, cigarettes, uncomplicated: Secondary | ICD-10-CM | POA: Insufficient documentation

## 2017-07-02 DIAGNOSIS — R200 Anesthesia of skin: Secondary | ICD-10-CM | POA: Insufficient documentation

## 2017-07-02 DIAGNOSIS — R202 Paresthesia of skin: Secondary | ICD-10-CM | POA: Insufficient documentation

## 2017-07-02 DIAGNOSIS — I10 Essential (primary) hypertension: Secondary | ICD-10-CM | POA: Insufficient documentation

## 2017-07-02 MED ORDER — AMOXICILLIN 875 MG PO TABS: 875.0000 mg | ORAL_TABLET | Freq: Two times a day (BID) | ORAL | 0 refills | Status: AC

## 2017-07-02 MED ORDER — IBUPROFEN 600 MG PO TABS: 600.0000 mg | ORAL_TABLET | Freq: Four times a day (QID) | ORAL | 0 refills | Status: DC | PRN

## 2017-07-02 NOTE — Discharge Instructions (Signed)
Please wear a carpal tunnel brace at all times except for showering for 4 weeks.  Take ibuprofen as needed for pain.  Please call orthopedic office in 4 weeks if no improvement with numbness and tingling in the left hand.  Please keep the left ear clean at all times.  Placed cotton into the left ear when showering.  Take antibiotics as prescribed to prevent infection in the left ear.  Please call ENT physician tomorrow morning to schedule follow-up appointment.  Return to the ER for any fevers, drainage of the left ear, increasing pain, worsening symptoms or urgent changes in your health.
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
## 2017-07-14 ENCOUNTER — Emergency Department: Payer: Self-pay

## 2017-07-14 ENCOUNTER — Encounter: Payer: Self-pay | Admitting: Emergency Medicine

## 2017-07-14 ENCOUNTER — Emergency Department
Admission: EM | Admit: 2017-07-14 | Discharge: 2017-07-14 | Disposition: A | Discharge status: Home / Self Care | Payer: Self-pay | Attending: Emergency Medicine | Admitting: Emergency Medicine

## 2017-07-14 DIAGNOSIS — M25531 Pain in right wrist: Secondary | ICD-10-CM

## 2017-07-14 DIAGNOSIS — G5601 Carpal tunnel syndrome, right upper limb: Secondary | ICD-10-CM | POA: Insufficient documentation

## 2017-07-14 DIAGNOSIS — F17210 Nicotine dependence, cigarettes, uncomplicated: Secondary | ICD-10-CM | POA: Insufficient documentation

## 2017-07-14 DIAGNOSIS — F419 Anxiety disorder, unspecified: Secondary | ICD-10-CM | POA: Insufficient documentation

## 2017-07-14 DIAGNOSIS — F319 Bipolar disorder, unspecified: Secondary | ICD-10-CM | POA: Insufficient documentation

## 2017-07-14 DIAGNOSIS — I10 Essential (primary) hypertension: Secondary | ICD-10-CM | POA: Insufficient documentation

## 2017-07-14 MED ORDER — NAPROXEN 500 MG PO TABS: 500.0000 mg | ORAL_TABLET | Freq: Two times a day (BID) | ORAL | 0 refills | Status: DC

## 2017-07-14 NOTE — ED Notes (Signed)
Pt not in room when RN went to discharge. Discharge instructions and RX given to 1st RN at the stat desk. Pt presumed ambulatory and in no acute distress as they were not present in room.

## 2017-07-14 NOTE — ED Triage Notes (Signed)
Patient with complaint of right wrist and hand pain times two months. Patient states that it has become worse over the past week. Patient denies any injury. Patient states that she was seen here for similar pain in he left wrist and was diagnosed with carpal tunnel. Patient states that she has an appointment with orthopedic in march.

## 2017-07-14 NOTE — Discharge Instructions (Signed)
Keep your appointment with the orthopedist in March.  Begin taking naproxen 500 mg twice daily with food.  Wear wrist splint for support and protection.

## 2017-07-14 NOTE — ED Provider Notes (Addendum)
[facility] Emergency Department Provider Note  ____________________________________________   First MD Initiated Contact with Patient 07/14/17 [phone]     (approximate)  I have reviewed the triage vital signs and the nursing notes.   HISTORY  Chief Complaint Wrist Pain and Hand Pain   HPI Debbie Padilla is a 32 y.o. female is here with complaint of right wrist and hand pain for the last 2 months.  She states there are times when her fingers especially her third finger have some tingling and numbness.  Patient denies any injury.  She states she is right-hand dominant.  She is also seen for similar symptoms to her left wrist and was diagnosed with carpal tunnel.  Patient states she has an appointment with an orthopedist in March for her left hand.  X-rays were ordered from triage.  Patient rates her pain as an 8 out of 10.   Past Medical History:  Diagnosis Date  . Anxiety   . Bipolar disorder (HCC)   . Depression   . Hypertension    during pregnancy  . Preterm labor     Patient Active Problem List   Diagnosis Date Noted  . Gestational thrombocytopenia (HCC) 01/01/2016  . Abdominal pain 12/31/2015  . Active labor at term 12/31/2015  . Positive GBS test 12/27/2015  . Bipolar disorder (HCC) 12/07/2015  . H/O premature rupture of membranes in previous pregnancy, currently pregnant 11/10/2015  . H/O preterm delivery, currently pregnant 11/10/2015  . Prenatal care insufficient 11/10/2015  . Short interval between pregnancies affecting pregnancy in third trimester, antepartum 11/10/2015  . Abnormal drug screen 09/27/2015  . Tobacco abuse 10/06/2014    Past Surgical History:  Procedure Laterality Date  . ANKLE INCISION AND DRAINAGE Left July 2012  . DILATION AND CURETTAGE OF UTERUS    . TUBAL LIGATION Bilateral 01/01/2016   Procedure: POST PARTUM TUBAL LIGATION;  Surgeon: [name], MD;  Location: [facility] ORS;  Service: Gynecology;  Laterality:  Bilateral;    Prior to Admission medications   Medication Sig Start Date End Date Taking? Authorizing Provider  naproxen (NAPROSYN) 500 MG tablet Take 1 tablet (500 mg total) by mouth 2 (two) times daily with a meal. 07/14/17   Tommi Rumps, PA-C    Allergies Patient has no known allergies.  Family History  Problem Relation Age of Onset  . Asthma Daughter   . Asthma Paternal Grandmother   . Diabetes Paternal Grandmother   . Cancer Neg Hx   . Heart disease Neg Hx     Social History Social History   Tobacco Use  . Smoking status: Current Every Day Smoker    Packs/day: 0.25    Years: 11.00    Pack years: 2.75    Types: Cigarettes  . Smokeless tobacco: Never Used  Substance Use Topics  . Alcohol use: Yes    Comment: rarely  . Drug use: No    Comment: States was taking it for appetite but has stopped since 1 month    Review of Systems Constitutional: No fever/chills Cardiovascular: Denies chest pain. Respiratory: Denies shortness of breath. Musculoskeletal: Positive left hand and wrist pain. Skin: Negative for rash. Neurological: Negative for  focal weakness or numbness.  Positive intermittent numbness to digits especially third finger right hand. ____________________________________________   PHYSICAL EXAM:  VITAL SIGNS: ED Triage Vitals [07/14/17 1945]  Enc Vitals Group     BP 123/77     Pulse Rate 79     Resp 18  Temp 98.3 F (36.8 C)     Temp Source Oral     SpO2 100 %     Weight 157 lb (71.2 kg)     Height 5\' 2"  (1.575 m)     Head Circumference      Peak Flow      Pain Score 8     Pain Loc      Pain Edu?      Excl. in GC?    Constitutional: Alert and oriented. Well appearing and in no acute distress. Eyes: Conjunctivae are normal.  Head: Atraumatic. Neck: No stridor.   Cardiovascular: Normal rate, regular rhythm. Grossly normal heart sounds.  Good peripheral circulation. Respiratory: Normal respiratory effort.  No retractions. Lungs  CTAB. Musculoskeletal: On examination of the left hand and wrist there is no gross deformity.  Patient was noted to be texting with bilateral hands prior to being examined.  Patient is able to flex and extend at her wrist without any difficulty.  Grip strength is equal bilaterally.  Skin is intact.  Capillary refill is less than 3 seconds.  Positive Tinel sign and pain is increased with extension of the right wrist. Neurologic:  Normal speech and language. No gross focal neurologic deficits are appreciated.  Skin:  Skin is warm, dry and intact.  Psychiatric: Mood and affect are normal. Speech and behavior are normal.  ____________________________________________   LABS (all labs ordered are listed, but only abnormal results are displayed)  Labs Reviewed - No data to display  RADIOLOGY  ED MD interpretation:   Right wrist and hand is negative for acute injury.  Official radiology report(s): Dg Wrist Complete Right  Result Date: 07/14/2017 CLINICAL DATA:  Right hand and wrist pain.  No known injury. EXAM: RIGHT WRIST - COMPLETE 3+ VIEW COMPARISON:  None. FINDINGS: There is no evidence of fracture or dislocation. There is no evidence of arthropathy or other focal bone abnormality. Soft tissues are unremarkable. IMPRESSION: Negative. Electronically Signed   By: Charlett Nose M.D.   On: 07/14/2017 20:19   Dg Hand Complete Right  Result Date: 07/14/2017 CLINICAL DATA:  Hand pain and wrist pain for 2 weeks. No known injury. EXAM: RIGHT HAND - COMPLETE 3+ VIEW COMPARISON:  None. FINDINGS: There is no evidence of fracture or dislocation. There is no evidence of arthropathy or other focal bone abnormality. Soft tissues are unremarkable. IMPRESSION: Negative. Electronically Signed   By: Charlett Nose M.D.   On: 07/14/2017 20:19    ____________________________________________   PROCEDURES  Procedure(s) performed:   .Splint Application Date/Time: 07/14/2017 10:15 PM Performed by: Fran Lowes, NT Authorized by: Tommi Rumps, PA-C   Consent:    Consent obtained:  Verbal   Consent given by:  Patient   Risks discussed:  Pain   Alternatives discussed:  Referral Pre-procedure details:    Sensation:  Numbness   Skin color:  Normal Procedure details:    Laterality:  Right   Location:  Wrist   Wrist:  R wrist   Splint type:  Volar short arm   Supplies:  Prefabricated splint Post-procedure details:    Pain:  Unchanged   Sensation:  Unchanged   Patient tolerance of procedure:  Tolerated well, no immediate complications    Critical Care performed: No  ____________________________________________   INITIAL IMPRESSION / ASSESSMENT AND PLAN / ED COURSE Patient was made aware that by history this sounds like carpal tunnel syndrome.  She already has an appointment with an orthopedist in  March.  She is encouraged to keep this appointment.  Patient was placed in a cockup wrist splint and given a prescription for naproxen 500 mg twice daily with food. ____________________________________________   FINAL CLINICAL IMPRESSION(S) / ED DIAGNOSES  Final diagnoses:  Right wrist pain  Carpal tunnel syndrome on right     ED Discharge Orders        Ordered    naproxen (NAPROSYN) 500 MG tablet  2 times daily with meals     07/14/17 2218       Note:  This document was prepared using Dragon voice recognition software and may include unintentional dictation errors.    Tommi RumpsSummers, Rhonda L, PA-C 07/14/17 2220    Tommi RumpsSummers, Rhonda L, PA-C 07/14/17 2221    Sharyn CreamerQuale, Mark, MD 07/15/17 [id number]

## 2017-07-27 ENCOUNTER — Other Ambulatory Visit: Payer: Self-pay

## 2017-07-27 ENCOUNTER — Emergency Department
Admission: EM | Admit: 2017-07-27 | Discharge: 2017-07-27 | Disposition: A | Discharge status: Home / Self Care | Payer: Self-pay | Attending: Emergency Medicine | Admitting: Emergency Medicine

## 2017-07-27 ENCOUNTER — Encounter: Payer: Self-pay | Admitting: Emergency Medicine

## 2017-07-27 DIAGNOSIS — J069 Acute upper respiratory infection, unspecified: Secondary | ICD-10-CM

## 2017-07-27 DIAGNOSIS — F17210 Nicotine dependence, cigarettes, uncomplicated: Secondary | ICD-10-CM | POA: Insufficient documentation

## 2017-07-27 DIAGNOSIS — K029 Dental caries, unspecified: Secondary | ICD-10-CM

## 2017-07-27 MED ORDER — PSEUDOEPH-BROMPHEN-DM 30-2-10 MG/5ML PO SYRP: 5.0000 mL | ORAL_SOLUTION | Freq: Four times a day (QID) | ORAL | 0 refills | Status: DC | PRN

## 2017-07-27 MED ORDER — NAPROXEN 500 MG PO TABS: 500.0000 mg | ORAL_TABLET | Freq: Two times a day (BID) | ORAL | Status: DC

## 2017-07-27 MED ORDER — AMOXICILLIN 500 MG PO CAPS: 500.0000 mg | ORAL_CAPSULE | Freq: Three times a day (TID) | ORAL | 0 refills | Status: DC

## 2017-07-27 NOTE — ED Notes (Signed)
See triage note  States she has been having fever and body aches about 1 week ago  States her children have been dx'd with flu  Afebrile on arrival   But also has had dental pain for about 1 month

## 2017-07-27 NOTE — ED Provider Notes (Signed)
[facility] Emergency Department Provider Note   ____________________________________________   First MD Initiated Contact with Patient 07/27/17 1511     (approximate)  I have reviewed the triage vital signs and the nursing notes.   HISTORY  Chief Complaint Dental Pain    HPI Debbie Padilla is a 32 y.o. female patient complain left lower dental pain for 2 months.  Patient also complained URI signs and symptoms consisting mostly of nasal, congestion postnasal drainage, and cough.  Patient denies nausea, vomiting, diarrhea.  Patient has taken flu shot for this season.  Patient rates the pain is 8/10.  Patient described the pain is "achy".  No palates measured for complaint.  Past Medical History:  Diagnosis Date  . Anxiety   . Bipolar disorder (HCC)   . Depression   . Hypertension    during pregnancy  . Preterm labor     Patient Active Problem List   Diagnosis Date Noted  . Gestational thrombocytopenia (HCC) 01/01/2016  . Abdominal pain 12/31/2015  . Active labor at term 12/31/2015  . Positive GBS test 12/27/2015  . Bipolar disorder (HCC) 12/07/2015  . H/O premature rupture of membranes in previous pregnancy, currently pregnant 11/10/2015  . H/O preterm delivery, currently pregnant 11/10/2015  . Prenatal care insufficient 11/10/2015  . Short interval between pregnancies affecting pregnancy in third trimester, antepartum 11/10/2015  . Abnormal drug screen 09/27/2015  . Tobacco abuse 10/06/2014    Past Surgical History:  Procedure Laterality Date  . ANKLE INCISION AND DRAINAGE Left July 2012  . DILATION AND CURETTAGE OF UTERUS    . TUBAL LIGATION Bilateral 01/01/2016   Procedure: POST PARTUM TUBAL LIGATION;  Surgeon: [name], MD;  Location: [facility] ORS;  Service: Gynecology;  Laterality: Bilateral;    Prior to Admission medications   Medication Sig Start Date End Date Taking? Authorizing Provider  amoxicillin (AMOXIL) 500 MG  capsule Take 1 capsule (500 mg total) by mouth 3 (three) times daily. 07/27/17   Joni Reining, PA-C  brompheniramine-pseudoephedrine-DM 30-2-10 MG/5ML syrup Take 5 mLs by mouth 4 (four) times daily as needed. 07/27/17   Joni Reining, PA-C  naproxen (NAPROSYN) 500 MG tablet Take 1 tablet (500 mg total) by mouth 2 (two) times daily with a meal. 07/14/17   Tommi Rumps, PA-C  naproxen (NAPROSYN) 500 MG tablet Take 1 tablet (500 mg total) by mouth 2 (two) times daily with a meal. 07/27/17   Joni Reining, PA-C    Allergies Patient has no known allergies.  Family History  Problem Relation Age of Onset  . Asthma Daughter   . Asthma Paternal Grandmother   . Diabetes Paternal Grandmother   . Cancer Neg Hx   . Heart disease Neg Hx     Social History Social History   Tobacco Use  . Smoking status: Current Every Day Smoker    Packs/day: 0.25    Years: 11.00    Pack years: 2.75    Types: Cigarettes  . Smokeless tobacco: Never Used  Substance Use Topics  . Alcohol use: Yes    Comment: rarely  . Drug use: No    Comment: States was taking it for appetite but has stopped since 1 month    Review of Systems  Constitutional: No fever/chills Eyes: No visual changes. ENT: No sore throat. Cardiovascular: Denies chest pain. Respiratory: Denies shortness of breath. Gastrointestinal: No abdominal pain.  No nausea, no vomiting.  No diarrhea.  No constipation. Genitourinary: Negative for  dysuria. Musculoskeletal: Negative for back pain. Skin: Negative for rash. Neurological: Negative for headaches, focal weakness or numbness. Psychiatric:Anxiety, bipolar, and depression Endocrine:Hypertension ____________________________________________   PHYSICAL EXAM:  VITAL SIGNS: ED Triage Vitals  Enc Vitals Group     BP 07/27/17 1256 (!) 151/78     Pulse Rate 07/27/17 1256 94     Resp 07/27/17 1256 18     Temp 07/27/17 1256 98.7 F (37.1 C)     Temp Source 07/27/17 1256 Oral     SpO2  07/27/17 1256 95 %     Weight 07/27/17 1258 159 lb (72.1 kg)     Height 07/27/17 1258 5\' 2"  (1.575 m)     Head Circumference --      Peak Flow --      Pain Score 07/27/17 1258 8     Pain Loc --      Pain Edu? --      Excl. in GC? --     Constitutional: Alert and oriented. Well appearing and in no acute distress. Nose: Edematous nasal turbinates clear rhinorrhea.   Mouth/Throat: Mucous membranes are moist.  Oropharynx non-erythematous.  Postnasal drainage.  Multiple caries upper and lower left molar area. Neck: No stridor.  Hematological/Lymphatic/Immunilogical: No cervical lymphadenopathy. Cardiovascular: Normal rate, regular rhythm. Grossly normal heart sounds.  Good peripheral circulation. Respiratory: Normal respiratory effort.  No retractions. Lungs CTAB. Gastrointestinal: Soft and nontender. No distention. No abdominal bruits. No CVA tenderness. Musculoskeletal: No lower extremity tenderness nor edema.  No joint effusions. Neurologic:  Normal speech and language. No gross focal neurologic deficits are appreciated. No gait instability. Skin:  Skin is warm, dry and intact. No rash noted. Psychiatric: Mood and affect are normal. Speech and behavior are normal.  ____________________________________________   LABS (all labs ordered are listed, but only abnormal results are displayed)  Labs Reviewed - No data to display ____________________________________________  EKG   ____________________________________________  RADIOLOGY  ED MD interpretation:    Official radiology report(s): No results found.  ____________________________________________   PROCEDURES  Procedure(s) performed:   Procedures  Critical Care performed: No  ____________________________________________   INITIAL IMPRESSION / ASSESSMENT AND PLAN / ED COURSE  As part of my medical decision making, I reviewed the following data within the electronic MEDICAL RECORD NUMBER    Dental pain and upper  respiratory infection.  Patient given discharge care instruction list of dental clinics for follow-up care.  Patient advised take medication as directed.  Patient given a work note.      ____________________________________________   FINAL CLINICAL IMPRESSION(S) / ED DIAGNOSES  Final diagnoses:  Pain due to dental caries  Upper respiratory tract infection, unspecified type     ED Discharge Orders        Ordered    amoxicillin (AMOXIL) 500 MG capsule  3 times daily     07/27/17 1513    naproxen (NAPROSYN) 500 MG tablet  2 times daily with meals     07/27/17 1513    brompheniramine-pseudoephedrine-DM 30-2-10 MG/5ML syrup  4 times daily PRN     07/27/17 1513       Note:  This document was prepared using Dragon voice recognition software and may include unintentional dictation errors.    Joni ReiningSmith, Ronald K, PA-C 07/27/17 1519    Nita SickleVeronese, [place], MD 07/27/17 [email or web address] [email or web address]

## 2017-07-27 NOTE — Discharge Instructions (Signed)
May follow-up from list of dental clinics provided: °OPTIONS FOR DENTAL FOLLOW UP CARE ° °[place] [facility] - Local Safety Net Dental Clinics °http://www.ncdhhs.gov/dph/oralhealth/services/safetynetclinics.htm °  °[facility] (336-562-3123) ° °[facility] [place] (919-933-9087) ° °[facility] ([phone] ext [phone]) ° °[facility] ([phone]) ° °[facility] ([phone]) °This clinic caters to the indigent population and is on a lottery system. °Location: °[facility], [address], [place] °Clinic Hours: °Wednesdays from 6pm - 9pm, patients seen by a lottery system. °For dates, call or go to www.med.[facility].edu/[facility]/patients/Dental-[facility] °Services: °Cleanings, fillings and simple extractions. °Payment Options: °DENTAL WORK IS FREE OF CHARGE. Bring proof of income or support. °Best way to get seen: °Arrive at 5:15 pm - this is a lottery, NOT first come/first serve, so arriving earlier will not increase your chances of being seen. °  °  °[facility] °[phone] °Select option 1 for emergencies °  °Location: °[facility], [address], [place] °Clinic Hours: °No walk-ins accepted - call the day before to schedule an appointment. °Check in times are 9:30 am and 1:30 pm. °Services: °Simple extractions, temporary fillings, pulpectomy/pulp debridement, uncomplicated abscess drainage. °Payment Options: °PAYMENT IS DUE AT THE TIME OF SERVICE.  Fee is usually $100-200, additional surgical procedures (e.g. abscess drainage) may be extra. °Cash, checks, Visa/MasterCard accepted.  Can file Medicaid if patient is covered for dental - patient should call case worker to check. °No discount for [facility] patients. °Best way to get seen: °MUST call the day before and get onto the schedule. Can usually be seen the next 1-2 days. No walk-ins accepted. °  °   °[facility] °[phone] °  °Location: °[facility], [address], [place] °Clinic Hours: °M, W, Th, F 8am or 1:30pm, Tues 9a or 1:30 - first come/first served. °Services: °Simple extractions, temporary fillings, uncomplicated abscess drainage.  You do not need to be an [place] resident. °Payment Options: °PAYMENT IS DUE AT THE TIME OF SERVICE. °Dental insurance, otherwise sliding scale - bring proof of income or support. °Depending on income and treatment needed, cost is usually $50-200. °Best way to get seen: °Arrive early as it is first come/first served. °  °  °[facility] °[phone] °  °Location: °[address] °Clinic Hours: °Mon-Thu 8a-5p °Services: °Most basic dental services including extractions and fillings. °Payment Options: °PAYMENT IS DUE AT THE TIME OF SERVICE. °Sliding scale, up to 50% off - bring proof if income or support. °Medicaid with dental option accepted. °Best way to get seen: °Call to schedule an appointment, can usually be seen within 2 weeks OR they will try to see walk-ins - show up at 8a or 2p (you may have to wait). °  °  °[facility] °[phone] °[place] RESIDENTS ONLY °  °Location: °[facility], [address], [place], [place] [address] °Clinic Hours: By appointment only. °Monday - Thursday 8am-5pm, Friday 8am-12pm °Services: Cleanings, fillings, extractions. °Payment Options: °PAYMENT IS DUE AT THE TIME OF SERVICE. °Cash, Visa or MasterCard. Sliding scale - $30 minimum per service. °Best way to get seen: °Come in to office, complete packet and make an appointment - need proof of income °or support monies for each household member and proof of [place] residence. °Usually takes about a month to get in. °  °  °[facility] °[phone] °  °Location: °[address] [place] °Clinic Hours: Walk-in Urgent Care  Dental Services are offered Monday-Friday mornings only. °The numbers of emergencies accepted daily is limited to the number of °providers available. °Maximum 15 - Mondays, Wednesdays & Thursdays °Maximum 10 - Tuesdays & Fridays °Services: °You do not need to be a [place] resident to be seen for a dental emergency. °Emergencies are defined as pain, swelling, abnormal bleeding, or dental trauma. Walkins will receive x-rays if needed. °NOTE: Dental cleaning is not an emergency. °Payment Options: °PAYMENT IS DUE AT THE TIME OF SERVICE. °Minimum co-pay is $40.00 for uninsured patients. °Minimum co-pay is $3.00 for Medicaid with dental coverage. °Dental Insurance is accepted and must be presented at time of visit. °Medicare does not cover dental. °Forms of payment: Cash, credit card, checks. °Best way to get seen: °If not previously registered with the clinic, walk-in dental registration begins at 7:15 am and is on a first come/first serve basis. °If previously registered with the clinic, call to make an appointment. °  °  °[facility] °[phone] °[place] RESIDENTS ONLY °  °Location: °[address], [place], [place] °Clinic Hours: °Mon-Thu 10a-2p °Services: Extractions only! °Payment Options: °FREE (donations accepted) - bring proof of income or support °Best way to get seen: °Call and schedule an appointment OR come at 8am on the 1st Monday of every month (except for holidays) when it is first come/first served. °  °  °[facility] °[phone] °  °Location: °[address], [place] °Clinic Hours: °Friday mornings °Services, Payment Options, Best way to get seen: °Call for info ° °

## 2017-07-27 NOTE — ED Triage Notes (Signed)
L lower dental pain x 2 months.

## 2017-08-19 ENCOUNTER — Emergency Department
Admission: EM | Admit: 2017-08-19 | Discharge: 2017-08-19 | Disposition: A | Discharge status: Home / Self Care | Payer: Self-pay | Attending: Student in an Organized Health Care Education/Training Program | Admitting: Student in an Organized Health Care Education/Training Program

## 2017-08-19 ENCOUNTER — Encounter: Payer: Self-pay | Admitting: Emergency Medicine

## 2017-08-19 ENCOUNTER — Other Ambulatory Visit: Payer: Self-pay

## 2017-08-19 DIAGNOSIS — Z79899 Other long term (current) drug therapy: Secondary | ICD-10-CM | POA: Insufficient documentation

## 2017-08-19 DIAGNOSIS — R112 Nausea with vomiting, unspecified: Secondary | ICD-10-CM

## 2017-08-19 DIAGNOSIS — F17210 Nicotine dependence, cigarettes, uncomplicated: Secondary | ICD-10-CM | POA: Insufficient documentation

## 2017-08-19 DIAGNOSIS — N3001 Acute cystitis with hematuria: Secondary | ICD-10-CM | POA: Insufficient documentation

## 2017-08-19 DIAGNOSIS — R197 Diarrhea, unspecified: Secondary | ICD-10-CM

## 2017-08-19 LAB — URINALYSIS, COMPLETE (UACMP) WITH MICROSCOPIC
Bilirubin Urine: NEGATIVE
GLUCOSE, UA: NEGATIVE mg/dL
HGB URINE DIPSTICK: NEGATIVE
Ketones, ur: 5 mg/dL — AB
NITRITE: POSITIVE — AB
PH: 7 (ref 5.0–8.0)
Protein, ur: 30 mg/dL — AB
SPECIFIC GRAVITY, URINE: 1.021 (ref 1.005–1.030)

## 2017-08-19 LAB — LIPASE, BLOOD: Lipase: 21 U/L (ref 11–51)

## 2017-08-19 LAB — COMPREHENSIVE METABOLIC PANEL
ALBUMIN: 4.2 g/dL (ref 3.5–5.0)
ALK PHOS: 64 U/L (ref 38–126)
ALT: 25 U/L (ref 14–54)
AST: 33 U/L (ref 15–41)
Anion gap: 6 (ref 5–15)
BILIRUBIN TOTAL: 0.6 mg/dL (ref 0.3–1.2)
BUN: 7 mg/dL (ref 6–20)
CALCIUM: 8.9 mg/dL (ref 8.9–10.3)
CO2: 25 mmol/L (ref 22–32)
Chloride: 104 mmol/L (ref 101–111)
Creatinine, Ser: 0.71 mg/dL (ref 0.44–1.00)
GFR calc Af Amer: >60 mL/min (ref >60)
GLUCOSE: 93 mg/dL (ref 65–99)
Potassium: 3.4 mmol/L — ABNORMAL LOW (ref 3.5–5.1)
Sodium: 135 mmol/L (ref 135–145)
TOTAL PROTEIN: 7.6 g/dL (ref 6.5–8.1)

## 2017-08-19 LAB — POCT PREGNANCY, URINE: PREG TEST UR: NEGATIVE

## 2017-08-19 LAB — CBC
HCT: 35 % (ref 35.0–47.0)
Hemoglobin: 11.2 g/dL — ABNORMAL LOW (ref 12.0–16.0)
MCH: 24.5 pg — ABNORMAL LOW (ref 26.0–34.0)
MCHC: 31.9 g/dL — AB (ref 32.0–36.0)
MCV: 76.6 fL — ABNORMAL LOW (ref 80.0–100.0)
PLATELETS: 208 10*3/uL (ref 150–440)
RBC: 4.57 MIL/uL (ref 3.80–5.20)
RDW: 16.9 % — AB (ref 11.5–14.5)
WBC: 8.3 10*3/uL (ref 3.6–11.0)

## 2017-08-19 MED ORDER — CEPHALEXIN 500 MG PO CAPS
500.0000 mg | ORAL_CAPSULE | Freq: Once | ORAL | Status: AC
  Administered 2017-08-19: 500 mg via ORAL
  Filled 2017-08-19: qty 1

## 2017-08-19 MED ORDER — PROCHLORPERAZINE EDISYLATE 5 MG/ML IJ SOLN
10.0000 mg | Freq: Once | INTRAMUSCULAR | Status: AC
  Administered 2017-08-19: 10 mg via INTRAVENOUS
  Filled 2017-08-19: qty 2

## 2017-08-19 MED ORDER — KETOROLAC TROMETHAMINE 30 MG/ML IJ SOLN
15.0000 mg | Freq: Once | INTRAMUSCULAR | Status: AC
  Administered 2017-08-19: 15 mg via INTRAVENOUS
  Filled 2017-08-19: qty 1

## 2017-08-19 MED ORDER — PROCHLORPERAZINE MALEATE 10 MG PO TABS: 10.0000 mg | ORAL_TABLET | Freq: Four times a day (QID) | ORAL | 0 refills | Status: DC | PRN

## 2017-08-19 MED ORDER — CEPHALEXIN 500 MG PO CAPS: 500.0000 mg | ORAL_CAPSULE | Freq: Three times a day (TID) | ORAL | 0 refills | Status: AC

## 2017-08-19 NOTE — ED Triage Notes (Signed)
Abdominal pain nausea and vomiting x3 days.

## 2017-08-19 NOTE — Discharge Instructions (Signed)

## 2017-08-20 NOTE — ED Provider Notes (Signed)
[facility] Emergency Department Provider Note    First MD Initiated Contact with Patient 08/19/17 1913     (approximate)  I have reviewed the triage vital signs and the nursing notes.   HISTORY  Chief Complaint Abdominal Pain    HPI Debbie Padilla is a 32 y.o. female history of anxiety, bipolar, depression presents with chief complaint of abdominal pain nausea vomiting as well as loose stools times 3 days.  States she is primarily having epigastric pain as well as some increased urinary frequency.  No dysuria.  No vaginal discharge.  Has had decreased oral intake.  Past Medical History:  Diagnosis Date  . Anxiety   . Bipolar disorder (HCC)   . Depression   . Hypertension    during pregnancy  . Preterm labor    Family History  Problem Relation Age of Onset  . Asthma Daughter   . Asthma Paternal Grandmother   . Diabetes Paternal Grandmother   . Cancer Neg Hx   . Heart disease Neg Hx    Past Surgical History:  Procedure Laterality Date  . ANKLE INCISION AND DRAINAGE Left July 2012  . DILATION AND CURETTAGE OF UTERUS    . TUBAL LIGATION Bilateral 01/01/2016   Procedure: POST PARTUM TUBAL LIGATION;  Surgeon: Hildred LaserAnika Cherry, MD;  Location: [facility] ORS;  Service: Gynecology;  Laterality: Bilateral;   Patient Active Problem List   Diagnosis Date Noted  . Gestational thrombocytopenia (HCC) 01/01/2016  . Abdominal pain 12/31/2015  . Active labor at term 12/31/2015  . Positive GBS test 12/27/2015  . Bipolar disorder (HCC) 12/07/2015  . H/O premature rupture of membranes in previous pregnancy, currently pregnant 11/10/2015  . H/O preterm delivery, currently pregnant 11/10/2015  . Prenatal care insufficient 11/10/2015  . Short interval between pregnancies affecting pregnancy in third trimester, antepartum 11/10/2015  . Abnormal drug screen 09/27/2015  . Tobacco abuse 10/06/2014      Prior to Admission medications   Medication Sig Start  Date End Date Taking? Authorizing Provider  amoxicillin (AMOXIL) 500 MG capsule Take 1 capsule (500 mg total) by mouth 3 (three) times daily. 07/27/17   Joni ReiningSmith, Ronald K, PA-C  brompheniramine-pseudoephedrine-DM 30-2-10 MG/5ML syrup Take 5 mLs by mouth 4 (four) times daily as needed. 07/27/17   Joni ReiningSmith, Ronald K, PA-C  cephALEXin (KEFLEX) 500 MG capsule Take 1 capsule (500 mg total) by mouth 3 (three) times daily for 7 days. 08/19/17 08/26/17  Willy Eddyobinson, Jorden Minchey, MD  naproxen (NAPROSYN) 500 MG tablet Take 1 tablet (500 mg total) by mouth 2 (two) times daily with a meal. 07/14/17   Tommi RumpsSummers, Rhonda L, PA-C  naproxen (NAPROSYN) 500 MG tablet Take 1 tablet (500 mg total) by mouth 2 (two) times daily with a meal. 07/27/17   Joni ReiningSmith, Ronald K, PA-C  prochlorperazine (COMPAZINE) 10 MG tablet Take 1 tablet (10 mg total) by mouth every 6 (six) hours as needed for nausea or vomiting. 08/19/17   Willy Eddyobinson, Armie Moren, MD    Allergies Patient has no known allergies.    Social History Social History   Tobacco Use  . Smoking status: Current Every Day Smoker    Packs/day: 0.25    Years: 11.00    Pack years: 2.75    Types: Cigarettes  . Smokeless tobacco: Never Used  Substance Use Topics  . Alcohol use: Yes    Comment: rarely  . Drug use: No    Comment: States was taking it for appetite but has stopped since 1 month  Review of Systems Patient denies headaches, rhinorrhea, blurry vision, numbness, shortness of breath, chest pain, edema, cough, abdominal pain, nausea, vomiting, diarrhea, dysuria, fevers, rashes or hallucinations unless otherwise stated above in HPI. ____________________________________________   PHYSICAL EXAM:  VITAL SIGNS: Vitals:   08/19/17 1718  BP: 131/80  Pulse: 79  Temp: 99.1 F (37.3 C)  SpO2: 100%    Constitutional: Alert and oriented. Well appearing and in no acute distress. Eyes: Conjunctivae are normal.  Head: Atraumatic. Nose: No congestion/rhinnorhea. Mouth/Throat: Mucous  membranes are moist.   Neck: No stridor. Painless ROM.  Cardiovascular: Normal rate, regular rhythm. Grossly normal heart sounds.  Good peripheral circulation. Respiratory: Normal respiratory effort.  No retractions. Lungs CTAB. Gastrointestinal: Soft and nontender. No distention. No abdominal bruits. No CVA tenderness. Genitourinary: deferred Musculoskeletal: No lower extremity tenderness nor edema.  No joint effusions. Neurologic:  Normal speech and language. No gross focal neurologic deficits are appreciated. No facial droop Skin:  Skin is warm, dry and intact. No rash noted. Psychiatric: Mood and affect are normal. Speech and behavior are normal.  ____________________________________________   LABS (all labs ordered are listed, but only abnormal results are displayed)  Results for orders placed or performed during the hospital encounter of 08/19/17 (from the past 24 hour(s))  Lipase, blood     Status: None   Collection Time: 08/19/17  5:21 PM  Result Value Ref Range   Lipase 21 11 - 51 U/L  Comprehensive metabolic panel     Status: Abnormal   Collection Time: 08/19/17  5:21 PM  Result Value Ref Range   Sodium 135 135 - 145 mmol/L   Potassium 3.4 (L) 3.5 - 5.1 mmol/L   Chloride 104 101 - 111 mmol/L   CO2 25 22 - 32 mmol/L   Glucose, Bld 93 65 - 99 mg/dL   BUN 7 6 - 20 mg/dL   Creatinine, Ser 1.61 0.44 - 1.00 mg/dL   Calcium 8.9 8.9 - 09.6 mg/dL   Total Protein 7.6 6.5 - 8.1 g/dL   Albumin 4.2 3.5 - 5.0 g/dL   AST 33 15 - 41 U/L   ALT 25 14 - 54 U/L   Alkaline Phosphatase 64 38 - 126 U/L   Total Bilirubin 0.6 0.3 - 1.2 mg/dL   GFR calc non Af Amer >60 >60 mL/min   GFR calc Af Amer >60 >60 mL/min   Anion gap 6 5 - 15  CBC     Status: Abnormal   Collection Time: 08/19/17  5:21 PM  Result Value Ref Range   WBC 8.3 3.6 - 11.0 K/uL   RBC 4.57 3.80 - 5.20 MIL/uL   Hemoglobin 11.2 (L) 12.0 - 16.0 g/dL   HCT 04.5 40.9 - 81.1 %   MCV 76.6 (L) 80.0 - 100.0 fL   MCH 24.5 (L)  26.0 - 34.0 pg   MCHC 31.9 (L) 32.0 - 36.0 g/dL   RDW 91.4 (H) 78.2 - 95.6 %   Platelets 208 150 - 440 K/uL  Urinalysis, Complete w Microscopic     Status: Abnormal   Collection Time: 08/19/17  5:21 PM  Result Value Ref Range   Color, Urine AMBER (A) YELLOW   APPearance CLOUDY (A) CLEAR   Specific Gravity, Urine 1.021 1.005 - 1.030   pH 7.0 5.0 - 8.0   Glucose, UA NEGATIVE NEGATIVE mg/dL   Hgb urine dipstick NEGATIVE NEGATIVE   Bilirubin Urine NEGATIVE NEGATIVE   Ketones, ur 5 (A) NEGATIVE mg/dL   Protein, ur  30 (A) NEGATIVE mg/dL   Nitrite POSITIVE (A) NEGATIVE   Leukocytes, UA TRACE (A) NEGATIVE   RBC / HPF 0-5 0 - 5 RBC/hpf   WBC, UA 6-30 0 - 5 WBC/hpf   Bacteria, UA MANY (A) NONE SEEN   Squamous Epithelial / LPF 6-30 (A) NONE SEEN   Mucus PRESENT   Pregnancy, urine POC     Status: None   Collection Time: 08/19/17  5:33 PM  Result Value Ref Range   Preg Test, Ur NEGATIVE NEGATIVE   ____________________________________________  ____________________________________________  RADIOLOGY   ____________________________________________   PROCEDURES  Procedure(s) performed:  Procedures    Critical Care performed: no ____________________________________________   INITIAL IMPRESSION / ASSESSMENT AND PLAN / ED COURSE  Pertinent labs & imaging results that were available during my care of the patient were reviewed by me and considered in my medical decision making (see chart for details).  DDX: Dehydration, enteritis, gastritis, pregnancy, UTI, [place], TOA, appendicitis  Debbie Padilla is a 32 y.o. who presents to the ED with symptoms as described above.  Given the nausea vomiting and a real illness over 3 days does seem clinically most consistent with enteritis and probable viral illness.  Abdominal exams not clinically consistent with acute appendicitis at this time.  Does have evidence of UTI.  Patient's not pregnant.  Not clinically consistent with PID or TOA.   Patient will be given p.o. challenge and reassessment.  Clinical Course as of Aug 20 40  Mon Aug 19, 2017  2118 Patient reassessed.  states that she feels much improved.  This point will discharge home with prescription for Keflex as well as nausea medication.  Discussed and reiterated need to return to the ER in 12-24 hours if symptoms not improved.   [PR]    Clinical Course User Index [PR] Willy Eddy, MD     As part of my medical decision making, I reviewed the following data within the electronic MEDICAL RECORD NUMBER Nursing notes reviewed and incorporated, Labs reviewed, notes from prior ED visits .   ____________________________________________   FINAL CLINICAL IMPRESSION(S) / ED DIAGNOSES  Final diagnoses:  Nausea vomiting and diarrhea  Acute cystitis with hematuria      NEW MEDICATIONS STARTED DURING THIS VISIT:  Discharge Medication List as of 08/19/2017  9:20 PM    START taking these medications   Details  cephALEXin (KEFLEX) 500 MG capsule Take 1 capsule (500 mg total) by mouth 3 (three) times daily for 7 days., Starting Mon 08/19/2017, Until Mon 08/26/2017, Print    prochlorperazine (COMPAZINE) 10 MG tablet Take 1 tablet (10 mg total) by mouth every 6 (six) hours as needed for nausea or vomiting., Starting Mon 08/19/2017, Print         Note:  This document was prepared using Dragon voice recognition software and may include unintentional dictation errors.    Willy Eddy, MD 08/20/17 [phone]

## 2017-10-02 ENCOUNTER — Emergency Department
Admission: EM | Admit: 2017-10-02 | Discharge: 2017-10-02 | Disposition: A | Discharge status: Home / Self Care | Payer: Self-pay | Attending: Emergency Medicine | Admitting: Emergency Medicine

## 2017-10-02 ENCOUNTER — Encounter: Payer: Self-pay | Admitting: Emergency Medicine

## 2017-10-02 ENCOUNTER — Other Ambulatory Visit: Payer: Self-pay

## 2017-10-02 DIAGNOSIS — K644 Residual hemorrhoidal skin tags: Secondary | ICD-10-CM | POA: Insufficient documentation

## 2017-10-02 DIAGNOSIS — F17210 Nicotine dependence, cigarettes, uncomplicated: Secondary | ICD-10-CM | POA: Insufficient documentation

## 2017-10-02 MED ORDER — LIDOCAINE 5 % EX OINT: 1.0000 "application " | TOPICAL_OINTMENT | CUTANEOUS | 0 refills | Status: DC | PRN

## 2017-10-02 MED ORDER — HYDROCODONE-ACETAMINOPHEN 5-325 MG PO TABS: 1.0000 | ORAL_TABLET | Freq: Four times a day (QID) | ORAL | 0 refills | Status: DC | PRN

## 2017-10-02 MED ORDER — HYDROCORTISONE ACETATE 25 MG RE SUPP: 25.0000 mg | Freq: Two times a day (BID) | RECTAL | 0 refills | Status: DC

## 2017-10-02 NOTE — ED Triage Notes (Signed)
Pt states she has a "pinch and stinging" feeling at her rectum this am, did feel "something" like extra skin there yesterday. Denies constipation.  Appears in no distress, also states fever and chills, pt eating bojangles in triage.

## 2017-10-02 NOTE — ED Notes (Signed)
Denies recent anal sex.

## 2017-10-02 NOTE — Discharge Instructions (Addendum)
Follow-up with your regular doctor or return to the emergency department if this area is worsening.  If it is becoming thrombosed it will need to be opened.  Soak in warm water with Epson salts at least 3 times a day if possible.  Use the medication as prescribed.  You will also need to take a stool softener.  Try and eat a high-fiber diet to reduce any constipation symptoms.

## 2017-10-02 NOTE — ED Provider Notes (Signed)
[facility] Emergency Department Provider Note  ____________________________________________   First MD Initiated Contact with Patient 10/02/17 1011     (approximate)  I have reviewed the triage vital signs and the nursing notes.   HISTORY  Chief Complaint Rectal Pain    HPI Debbie Padilla is a 32 y.o. female presents emergency department complaining of rectal pain.  States feels like there is a piece of skin hanging there.  States it burns and hurts at times.  Is painful to sit.  She denies any constipation.  She denies any bleeding from the area.  Past Medical History:  Diagnosis Date  . Anxiety   . Bipolar disorder (HCC)   . Depression   . Hypertension    during pregnancy  . Preterm labor     Patient Active Problem List   Diagnosis Date Noted  . Gestational thrombocytopenia (HCC) 01/01/2016  . Abdominal pain 12/31/2015  . Active labor at term 12/31/2015  . Positive GBS test 12/27/2015  . Bipolar disorder (HCC) 12/07/2015  . H/O premature rupture of membranes in previous pregnancy, currently pregnant 11/10/2015  . H/O preterm delivery, currently pregnant 11/10/2015  . Prenatal care insufficient 11/10/2015  . Short interval between pregnancies affecting pregnancy in third trimester, antepartum 11/10/2015  . Abnormal drug screen 09/27/2015  . Tobacco abuse 10/06/2014    Past Surgical History:  Procedure Laterality Date  . ANKLE INCISION AND DRAINAGE Left July 2012  . DILATION AND CURETTAGE OF UTERUS    . TUBAL LIGATION Bilateral 01/01/2016   Procedure: POST PARTUM TUBAL LIGATION;  Surgeon: [name], MD;  Location: [facility] ORS;  Service: Gynecology;  Laterality: Bilateral;    Prior to Admission medications   Medication Sig Start Date End Date Taking? Authorizing Provider  HYDROcodone-acetaminophen (NORCO/VICODIN) 5-325 MG tablet Take 1 tablet by mouth every 6 (six) hours as needed for moderate pain. 10/02/17   Maliya Marich, Roselyn Bering,  PA-C  hydrocortisone (ANUSOL-HC) 25 MG suppository Place 1 suppository (25 mg total) rectally 2 (two) times daily. 10/02/17   Arsalan Brisbin, Roselyn Bering, PA-C  lidocaine (XYLOCAINE) 5 % ointment Apply 1 application topically as needed. 10/02/17   Faythe Ghee, PA-C    Allergies Patient has no known allergies.  Family History  Problem Relation Age of Onset  . Asthma Daughter   . Asthma Paternal Grandmother   . Diabetes Paternal Grandmother   . Cancer Neg Hx   . Heart disease Neg Hx     Social History Social History   Tobacco Use  . Smoking status: Current Every Day Smoker    Packs/day: 0.25    Years: 11.00    Pack years: 2.75    Types: Cigarettes  . Smokeless tobacco: Never Used  Substance Use Topics  . Alcohol use: Yes    Comment: rarely  . Drug use: No    Comment: States was taking it for appetite but has stopped since 1 month    Review of Systems  Constitutional: No fever/chills Eyes: No visual changes. ENT: No sore throat. Respiratory: Denies cough Rectal.  Positive for rectal pain Genitourinary: Negative for dysuria. Musculoskeletal: Negative for back pain. Skin: Negative for rash.    ____________________________________________   PHYSICAL EXAM:  VITAL SIGNS: ED Triage Vitals  Enc Vitals Group     BP 10/02/17 0939 134/73     Pulse Rate 10/02/17 0939 84     Resp 10/02/17 0939 18     Temp 10/02/17 0939 98.3 F (36.8 C)  Temp Source 10/02/17 0939 Oral     SpO2 10/02/17 0939 100 %     Weight 10/02/17 0945 151 lb (68.5 kg)     Height 10/02/17 0945  (1.575 m)     Head Circumference --      Peak Flow --      Pain Score 10/02/17 0945 8     Pain Loc --      Pain Edu? --      Excl. in GC? --     Constitutional: Alert and oriented. Well appearing and in no acute distress. Eyes: Conjunctivae are normal.  Head: Atraumatic. Nose: No congestion/rhinnorhea. Mouth/Throat: Mucous membranes are moist.   Cardiovascular: Normal rate, regular  rhythm. Respiratory: Normal respiratory effort.  No retractions GU: deferred Rectal: Positive for a piece sized hemorrhoid which is external.  There is no bruising or drainage noted.  It is not thrombosed at this time. Musculoskeletal: FROM all extremities, warm and well perfused Neurologic:  Normal speech and language.  Skin:  Skin is warm, dry and intact. No rash noted. Psychiatric: Mood and affect are normal. Speech and behavior are normal.  ____________________________________________   LABS (all labs ordered are listed, but only abnormal results are displayed)  Labs Reviewed - No data to display ____________________________________________   ____________________________________________  RADIOLOGY    ____________________________________________   PROCEDURES  Procedure(s) performed: No  Procedures    ____________________________________________   INITIAL IMPRESSION / ASSESSMENT AND PLAN / ED COURSE  Pertinent labs & imaging results that were available during my care of the patient were reviewed by me and considered in my medical decision making (see chart for details).  Patient is 32 year old female presents emergency department complaining of rectal pain.  States feels like there is skin that is swollen back there and that it hurts to sit.  States of burning type pain.  On physical exam there is a pea-sized external hemorrhoid.  The area is not thrombosed at this time.  Exam findings were explained to the patient.  I gave her the choice on whether she wanted me to lance this at this time.  The patient states she would rather try conservative measures first.  She was given a prescription for Anusol HC, lidocaine external ointments, and Norco 5/325 #15 with no refill.  She was instructed to use MiraLAX or another stool softener to prevent constipation from the medication and to aid in softening the stool due to the hemorrhoid.  She is to soak in a tub of warm water  with Epson salts.  She is to return to the emergency department if worsening for eval and possible incision and drainage of a hemorrhoid.  She states she understands will comply with our instructions.  She was discharged in stable condition     As part of my medical decision making, I reviewed the following data within the electronic MEDICAL RECORD NUMBER Nursing notes reviewed and incorporated, Notes from prior ED visits and [place] Controlled Substance Database  ____________________________________________   FINAL CLINICAL IMPRESSION(S) / ED DIAGNOSES  Final diagnoses:  External hemorrhoid      NEW MEDICATIONS STARTED DURING THIS VISIT:  Discharge Medication List as of 10/02/2017 10:48 AM    START taking these medications   Details  HYDROcodone-acetaminophen (NORCO/VICODIN) 5-325 MG tablet Take 1 tablet by mouth every 6 (six) hours as needed for moderate pain., Starting Wed 10/02/2017, Print    hydrocortisone (ANUSOL-HC) 25 MG suppository Place 1 suppository (25 mg total) rectally 2 (two) times daily.,  Starting Wed 10/02/2017, Print    lidocaine (XYLOCAINE) 5 % ointment Apply 1 application topically as needed., Starting Wed 10/02/2017, Print         Note:  This document was prepared using Dragon voice recognition software and may include unintentional dictation errors.    Faythe Ghee, PA-C 10/02/17 1601    Nita Sickle, MD 10/05/17 [phone]

## 2017-10-02 NOTE — ED Notes (Signed)
See triage note  Presents with some discomfort near her rectum  Thinks it may be an abscess or hemorrhoid   Describes pain as burning type pain

## 2017-10-04 DIAGNOSIS — I10 Essential (primary) hypertension: Secondary | ICD-10-CM | POA: Insufficient documentation

## 2017-10-04 DIAGNOSIS — K649 Unspecified hemorrhoids: Secondary | ICD-10-CM | POA: Insufficient documentation

## 2017-10-04 DIAGNOSIS — F17210 Nicotine dependence, cigarettes, uncomplicated: Secondary | ICD-10-CM | POA: Insufficient documentation

## 2017-10-05 ENCOUNTER — Encounter: Payer: Self-pay | Admitting: Emergency Medicine

## 2017-10-05 ENCOUNTER — Other Ambulatory Visit: Payer: Self-pay

## 2017-10-05 ENCOUNTER — Emergency Department
Admission: EM | Admit: 2017-10-05 | Discharge: 2017-10-05 | Disposition: A | Discharge status: Home / Self Care | Payer: Self-pay | Attending: Emergency Medicine | Admitting: Emergency Medicine

## 2017-10-05 DIAGNOSIS — K649 Unspecified hemorrhoids: Secondary | ICD-10-CM

## 2017-10-05 MED ORDER — KETOROLAC TROMETHAMINE 60 MG/2ML IM SOLN
60.0000 mg | Freq: Once | INTRAMUSCULAR | Status: AC
  Administered 2017-10-05: 60 mg via INTRAMUSCULAR
  Filled 2017-10-05: qty 2

## 2017-10-05 NOTE — Discharge Instructions (Addendum)
Please follow up with GI. Please take some tylenol and ibuprofen along with your rectal medication to help with pain.

## 2017-10-05 NOTE — ED Provider Notes (Signed)
[facility] Emergency Department Provider Note   ____________________________________________   First MD Initiated Contact with Patient 10/05/17 0245     (approximate)  I have reviewed the triage vital signs and the nursing notes.   HISTORY  Chief Complaint Hemorrhoids    HPI Debbie Padilla is a 32 y.o. female  who comes into the hospital today with a bad hemorrhoid.  The patient was here 2 days ago and was told that if it got bigger or was more painful she should come in and get it lanced.  The patient reports thatshe is been using her Anusol HC and lidocaine jelly but it has not been helping.  She reports that she is afraid to have a bowel movement because it is so painful.  The patient denies any hard bowel movements but states that she has been straining some.  She reports that the hemorrhoid is not purple.  The patient reports that anything she does makes the pain worse in her rectum so she is back tonight for further evaluation.   Past Medical History:  Diagnosis Date  . Anxiety   . Bipolar disorder (HCC)   . Depression   . Hypertension    during pregnancy  . Preterm labor     Patient Active Problem List   Diagnosis Date Noted  . Gestational thrombocytopenia (HCC) 01/01/2016  . Abdominal pain 12/31/2015  . Active labor at term 12/31/2015  . Positive GBS test 12/27/2015  . Bipolar disorder (HCC) 12/07/2015  . H/O premature rupture of membranes in previous pregnancy, currently pregnant 11/10/2015  . H/O preterm delivery, currently pregnant 11/10/2015  . Prenatal care insufficient 11/10/2015  . Short interval between pregnancies affecting pregnancy in third trimester, antepartum 11/10/2015  . Abnormal drug screen 09/27/2015  . Tobacco abuse 10/06/2014    Past Surgical History:  Procedure Laterality Date  . ANKLE INCISION AND DRAINAGE Left July 2012  . DILATION AND CURETTAGE OF UTERUS    . TUBAL LIGATION Bilateral 01/01/2016   Procedure: POST PARTUM TUBAL LIGATION;  Surgeon: [name], MD;  Location: [facility] ORS;  Service: Gynecology;  Laterality: Bilateral;    Prior to Admission medications   Medication Sig Start Date End Date Taking? Authorizing Provider  HYDROcodone-acetaminophen (NORCO/VICODIN) 5-325 MG tablet Take 1 tablet by mouth every 6 (six) hours as needed for moderate pain. 10/02/17   Fisher, Roselyn Bering, PA-C  hydrocortisone (ANUSOL-HC) 25 MG suppository Place 1 suppository (25 mg total) rectally 2 (two) times daily. 10/02/17   Fisher, Roselyn Bering, PA-C  lidocaine (XYLOCAINE) 5 % ointment Apply 1 application topically as needed. 10/02/17   Faythe Ghee, PA-C    Allergies Patient has no known allergies.  Family History  Problem Relation Age of Onset  . Asthma Daughter   . Asthma Paternal Grandmother   . Diabetes Paternal Grandmother   . Cancer Neg Hx   . Heart disease Neg Hx     Social History Social History   Tobacco Use  . Smoking status: Current Every Day Smoker    Packs/day: 0.25    Years: 11.00    Pack years: 2.75    Types: Cigarettes  . Smokeless tobacco: Never Used  Substance Use Topics  . Alcohol use: Yes    Comment: rarely  . Drug use: No    Comment: States was taking it for appetite but has stopped since 1 month    Review of Systems  Constitutional: No fever/chills Eyes: No visual changes. ENT: No sore  throat. Cardiovascular: Denies chest pain. Respiratory: Denies shortness of breath. Gastrointestinal: No abdominal pain.   Genitourinary: Rectal pain Musculoskeletal: Negative for back pain. Skin: Negative for rash. Neurological: Negative for headaches   ____________________________________________   PHYSICAL EXAM:  VITAL SIGNS: ED Triage Vitals  Enc Vitals Group     BP 10/04/17 2357 129/87     Pulse Rate 10/04/17 2357 70     Resp 10/04/17 2357 18     Temp 10/04/17 2357 98.2 F (36.8 C)     Temp Source 10/04/17 2357 Oral     SpO2 10/04/17 2357 100 %     Weight  10/04/17 2358 151 lb (68.5 kg)     Height 10/04/17 2358  (1.575 m)     Head Circumference --      Peak Flow --      Pain Score 10/04/17 2358 10     Pain Loc --      Pain Edu? --      Excl. in GC? --     Constitutional: Alert and oriented. Well appearing and in moderate distress. Eyes: Conjunctivae are normal. PERRL. EOMI. Head: Atraumatic. Nose: No congestion/rhinnorhea. Mouth/Throat: Mucous membranes are moist.  Oropharynx non-erythematous. Cardiovascular: Normal rate, regular rhythm. Grossly normal heart sounds.  Good peripheral circulation. Respiratory: Normal respiratory effort.  No retractions. Lungs CTAB. Gastrointestinal: Soft and nontender. No distention.  Positive bowel sounds Rectum: External hemorrhoid noted in rectum approximately 2.5 cm in size no discoloration but slightly firm, no bleeding Musculoskeletal: No lower extremity tenderness nor edema.   Neurologic:  Normal speech and language.  Skin:  Skin is warm, dry and intact.  Psychiatric: Mood and affect are normal.   ____________________________________________   LABS (all labs ordered are listed, but only abnormal results are displayed)  Labs Reviewed - No data to display ____________________________________________  EKG  None ____________________________________________  RADIOLOGY  ED MD interpretation: None  Official radiology report(s): No results found.  ____________________________________________   PROCEDURES  Procedure(s) performed: None  Procedures  Critical Care performed: No  ____________________________________________   INITIAL IMPRESSION / ASSESSMENT AND PLAN / ED COURSE  As part of my medical decision making, I reviewed the following data within the electronic MEDICAL RECORD NUMBER Notes from prior ED visits and [place] Controlled Substance Database   This is a 32 year old who comes into the hospital today with a hemorrhoid.  The patient states that it is getting worse and is  causing a lot of pain.  I discussed with the patient that it is contraindicated at this time to lance the hemorrhoid.  Given the area I will increase infection and will not help in improving pain.  I did give the patient some ibuprofen and Tylenol for her discomfort but she does need to continue doing sitz bath's as well as Anusol HC.  The patient also needs to make sure she is taking her MiraLAX and her stool softener.  The patient will be discharged home.      ____________________________________________   FINAL CLINICAL IMPRESSION(S) / ED DIAGNOSES  Final diagnoses:  Hemorrhoids, unspecified hemorrhoid type     ED Discharge Orders    None       Note:  This document was prepared using Dragon voice recognition software and may include unintentional dictation errors.    Rebecka Apley, MD 10/05/17 [phone]

## 2017-10-05 NOTE — ED Triage Notes (Signed)
Pt arrives POV to triage with c/o known hemorrhoids which pt reports are getting bigger since x 2 days ago. Per pt, she was told that they may have to be cut out. Pt is ambulatory to triage with NAD.

## 2018-02-10 ENCOUNTER — Emergency Department: Payer: Self-pay

## 2018-02-10 ENCOUNTER — Other Ambulatory Visit: Payer: Self-pay

## 2018-02-10 ENCOUNTER — Emergency Department
Admission: EM | Admit: 2018-02-10 | Discharge: 2018-02-10 | Disposition: A | Discharge status: Home / Self Care | Payer: Self-pay | Attending: Emergency Medicine | Admitting: Emergency Medicine

## 2018-02-10 ENCOUNTER — Encounter: Payer: Self-pay | Admitting: Emergency Medicine

## 2018-02-10 DIAGNOSIS — I10 Essential (primary) hypertension: Secondary | ICD-10-CM | POA: Insufficient documentation

## 2018-02-10 DIAGNOSIS — R0789 Other chest pain: Secondary | ICD-10-CM | POA: Insufficient documentation

## 2018-02-10 DIAGNOSIS — M79602 Pain in left arm: Secondary | ICD-10-CM | POA: Insufficient documentation

## 2018-02-10 DIAGNOSIS — Z79899 Other long term (current) drug therapy: Secondary | ICD-10-CM | POA: Insufficient documentation

## 2018-02-10 DIAGNOSIS — R5383 Other fatigue: Secondary | ICD-10-CM | POA: Insufficient documentation

## 2018-02-10 DIAGNOSIS — F17210 Nicotine dependence, cigarettes, uncomplicated: Secondary | ICD-10-CM | POA: Insufficient documentation

## 2018-02-10 LAB — CBC
HEMATOCRIT: 32.7 % — AB (ref 35.0–47.0)
HEMOGLOBIN: 10.6 g/dL — AB (ref 12.0–16.0)
MCH: 25.3 pg — ABNORMAL LOW (ref 26.0–34.0)
MCHC: 32.5 g/dL (ref 32.0–36.0)
MCV: 77.8 fL — AB (ref 80.0–100.0)
Platelets: 175 10*3/uL (ref 150–440)
RBC: 4.2 MIL/uL (ref 3.80–5.20)
RDW: 16.3 % — ABNORMAL HIGH (ref 11.5–14.5)
WBC: 7.2 10*3/uL (ref 3.6–11.0)

## 2018-02-10 LAB — BASIC METABOLIC PANEL
ANION GAP: 5 (ref 5–15)
BUN: 6 mg/dL (ref 6–20)
CHLORIDE: 106 mmol/L (ref 98–111)
CO2: 26 mmol/L (ref 22–32)
Calcium: 8.5 mg/dL — ABNORMAL LOW (ref 8.9–10.3)
Creatinine, Ser: 0.7 mg/dL (ref 0.44–1.00)
GFR calc Af Amer: >60 mL/min (ref >60)
GFR calc non Af Amer: >60 mL/min (ref >60)
Glucose, Bld: 68 mg/dL — ABNORMAL LOW (ref 70–99)
POTASSIUM: 3.4 mmol/L — AB (ref 3.5–5.1)
SODIUM: 137 mmol/L (ref 135–145)

## 2018-02-10 LAB — TROPONIN I: Troponin I: <0.03 ng/mL (ref <0.03)

## 2018-02-10 MED ORDER — ACETAMINOPHEN 500 MG PO TABS
1000.0000 mg | ORAL_TABLET | Freq: Once | ORAL | Status: AC
  Administered 2018-02-10: 1000 mg via ORAL
  Filled 2018-02-10: qty 2

## 2018-02-10 NOTE — ED Notes (Signed)
Pt given cereal and milk, peanut butter and graham crackers and apple juice. Encouraged to eat.

## 2018-02-10 NOTE — ED Notes (Signed)
Pt drank 6 oz apple juice and took cereal home. Pt calling for ride now.

## 2018-02-10 NOTE — ED Notes (Signed)
Pt alert and oriented X4, active, cooperative, pt in NAD. RR even and unlabored, color WNL.  Pt informed to return if any life threatening symptoms occur.  Discharge and followup instructions reviewed. Ambulates safely. 

## 2018-02-10 NOTE — ED Notes (Signed)
Pt has still not eaten anything. Because she went back to sleep.

## 2018-02-10 NOTE — Discharge Instructions (Signed)
Your workup in the Emergency Department today was reassuring.  We did not find any specific abnormalities.  We recommend you drink plenty of fluids, take your regular medications and/or any new ones prescribed today, and follow up with the doctor(s) listed in these documents as recommended.  Return to the Emergency Department if you develop new or worsening symptoms that concern you.  

## 2018-02-10 NOTE — ED Notes (Signed)
Report to ally, rn.  

## 2018-02-10 NOTE — ED Provider Notes (Signed)
[facility] Emergency Department Provider Note  ____________________________________________   First MD Initiated Contact with Patient 02/10/18 [phone]     (approximate)  I have reviewed the triage vital signs and the nursing notes.   HISTORY  Chief Complaint Chest Pain    HPI Debbie Padilla is a 32 y.o. female with medical and psychiatric history as listed below who presents by EMS for evaluation of fatigue, vague left-sided chest pain around her axilla, and left arm pain.  She was working her usual overnight shift when she developed the pain in her left arm.  She does a repetitive lifting and twisting motion throughout her 12-hour shift.  The pain was severe earlier but now it is mostly gone.  Nothing in particular makes his symptoms better nor worse.  She describes the pain as an aching sensation throughout her left arm as well as in the left upper chest around the axilla but not extending into the center of her chest.  She denies shortness of breath, nausea, vomiting, abdominal pain, dysuria.  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  She fell asleep 3 times during my history and physical.  Past Medical History:  Diagnosis Date  . Anxiety   . Bipolar disorder (HCC)   . Depression   . Hypertension    during pregnancy  . Preterm labor     Patient Active Problem List   Diagnosis Date Noted  . Gestational thrombocytopenia (HCC) 01/01/2016  . Abdominal pain 12/31/2015  . Active labor at term 12/31/2015  . Positive GBS test 12/27/2015  . Bipolar disorder (HCC) 12/07/2015  . H/O premature rupture of membranes in previous pregnancy, currently pregnant 11/10/2015  . H/O preterm delivery, currently pregnant 11/10/2015  . Prenatal care insufficient 11/10/2015  . Short interval between pregnancies affecting pregnancy in third trimester, antepartum  11/10/2015  . Abnormal drug screen 09/27/2015  . Tobacco abuse 10/06/2014    Past Surgical History:  Procedure Laterality Date  . ANKLE INCISION AND DRAINAGE Left July 2012  . DILATION AND CURETTAGE OF UTERUS    . TUBAL LIGATION Bilateral 01/01/2016   Procedure: POST PARTUM TUBAL LIGATION;  Surgeon: [name], MD;  Location: [facility] ORS;  Service: Gynecology;  Laterality: Bilateral;    Prior to Admission medications   Medication Sig Start Date End Date Taking? Authorizing Provider  HYDROcodone-acetaminophen (NORCO/VICODIN) 5-325 MG tablet Take 1 tablet by mouth every 6 (six) hours as needed for moderate pain. 10/02/17   Fisher, Roselyn Bering, PA-C  hydrocortisone (ANUSOL-HC) 25 MG suppository Place 1 suppository (25 mg total) rectally 2 (two) times daily. 10/02/17   Fisher, Roselyn Bering, PA-C  lidocaine (XYLOCAINE) 5 % ointment Apply 1 application topically as needed. 10/02/17   Faythe Ghee, PA-C    Allergies Patient has no known allergies.  Family History  Problem Relation Age of Onset  . Asthma Daughter   . Asthma Paternal Grandmother   . Diabetes Paternal Grandmother   . Cancer Neg Hx   . Heart disease Neg Hx     Social History Social History   Tobacco Use  . Smoking status: Current Every Day Smoker    Packs/day: 0.25    Years: 11.00    Pack years: 2.75    Types: Cigarettes  . Smokeless tobacco: Never Used  Substance Use Topics  . Alcohol use: Yes    Comment:  rarely  . Drug use: No    Comment: States was taking it for appetite but has stopped since 1 month    Review of Systems Constitutional: No fever/chills Eyes: No visual changes. ENT: No sore throat. Cardiovascular: Denies chest pain. Respiratory: Denies shortness of breath. Gastrointestinal: No abdominal pain.  No nausea, no vomiting.  No diarrhea.  No constipation. Genitourinary: Negative for dysuria. Musculoskeletal: Negative for neck pain.  Negative for back pain. Integumentary: Negative for  rash. Neurological: Negative for headaches, focal weakness or numbness.   ____________________________________________   PHYSICAL EXAM:  VITAL SIGNS: ED Triage Vitals  Enc Vitals Group     BP 02/10/18 0518 125/79     Pulse Rate 02/10/18 0518 71     Resp 02/10/18 0518 16     Temp 02/10/18 0518 98.5 F (36.9 C)     Temp Source 02/10/18 0518 Oral     SpO2 02/10/18 0518 100 %     Weight 02/10/18 0519 64 kg (141 lb)     Height 02/10/18 0519 1.575 m (5\' 2" )     Head Circumference --      Peak Flow --      Pain Score 02/10/18 0518 7     Pain Loc --      Pain Edu? --      Excl. in GC? --     Constitutional: Alert and oriented. Well appearing and in no acute distress.  Somnolent but generally appropriate and awakens to light voice. Eyes: Conjunctivae are normal.  Head: Atraumatic. Nose: No congestion/rhinnorhea. Mouth/Throat: Mucous membranes are moist. Neck: No stridor.  No meningeal signs.   Cardiovascular: Normal rate, regular rhythm. Good peripheral circulation. Grossly normal heart sounds. Respiratory: Normal respiratory effort.  No retractions. Lungs CTAB. Gastrointestinal: Soft and nontender. No distention.  Musculoskeletal: No lower extremity tenderness nor edema. No gross deformities of extremities.  No reproducible pain in the left arm or axilla with range of motion of left arm or with palpation. Neurologic:  Normal speech and language. No gross focal neurologic deficits are appreciated.  Patient is very sleepy, however, and has a hard time staying awake during my assessment. Skin:  Skin is warm, dry and intact. No rash noted.   ____________________________________________   LABS (all labs ordered are listed, but only abnormal results are displayed)  Labs Reviewed  BASIC METABOLIC PANEL - Abnormal; Notable for the following components:      Result Value   Potassium 3.4 (*)    Glucose, Bld 68 (*)    Calcium 8.5 (*)    All other components within normal limits   CBC - Abnormal; Notable for the following components:   Hemoglobin 10.6 (*)    HCT 32.7 (*)    MCV 77.8 (*)    MCH 25.3 (*)    RDW 16.3 (*)    All other components within normal limits  TROPONIN I   ____________________________________________  EKG  ED ECG REPORT I, Loleta Rose, the attending physician, personally viewed and interpreted this ECG.  Date: 02/10/2018 EKG Time: 7:24 Rate: 68 Rhythm: normal sinus rhythm QRS Axis: normal Intervals: normal ST/T Wave abnormalities: Non-specific ST segment / T-wave changes, but no evidence of acute ischemia. Narrative Interpretation: no evidence of acute ischemia   ____________________________________________  RADIOLOGY I, Loleta Rose, personally viewed and evaluated these images (plain radiographs) as part of my medical decision making, as well as reviewing the written report by the radiologist.  ED MD interpretation:  No acute processes  Official  radiology report(s): Dg Chest 2 View  Result Date: 02/10/2018 CLINICAL DATA:  32 y/o F; left-sided chest pain radiating to the back and left arm. EXAM: CHEST - 2 VIEW COMPARISON:  04/04/2005 chest radiograph FINDINGS: Stable heart size and mediastinal contours are within normal limits. Symmetric nipple shadows over lower lung zones. No consolidation, or pneumothorax. The visualized skeletal structures are unremarkable. IMPRESSION: No acute pulmonary process identified. Electronically Signed   By: Mitzi HansenLance  Furusawa-Stratton M.D.   On: 02/10/2018 06:09    ____________________________________________   PROCEDURES  Critical Care performed: No   Procedure(s) performed:   Procedures   ____________________________________________   INITIAL IMPRESSION / ASSESSMENT AND PLAN / ED COURSE  As part of my medical decision making, I reviewed the following data within the electronic MEDICAL RECORD NUMBER Nursing notes reviewed and incorporated, Labs reviewed , EKG interpreted , Old chart  reviewed, Radiograph reviewed  and Notes from prior ED visits    Differential diagnosis includes, but is not limited to, exhaustion/fatigue, musculoskeletal pain/strain, less likely ACS, pneumonia, or pneumothorax.  She is in no acute distress with stable vital signs.  Troponin is negative, BMP and CBC are essentially within normal limits except for very slightly decreased potassium.  Her glucose is a little bit low and we gave her a meal which she without difficulty.  She is low risk for ACS based on HEART score and PERC negative.  I think that her pain and discomfort is secondary to her exhaustion and the work she does on her night shift.  She agrees with this assessment when we talked about her reassuring results.  EKG and chest x-ray are within normal limits as well.  I will give her a note so that she can rest up for a couple of days and I gave information about how to follow-up with the primary care provider.  I gave my usual and customary return precautions.  She understands and agrees with the plan.     ____________________________________________  FINAL CLINICAL IMPRESSION(S) / ED DIAGNOSES  Final diagnoses:  Atypical chest pain  Left arm pain  Exhaustion     MEDICATIONS GIVEN DURING THIS VISIT:  Medications  acetaminophen (TYLENOL) tablet 1,000 mg (has no administration in time range)     ED Discharge Orders    None       Note:  This document was prepared using Dragon voice recognition software and may include unintentional dictation errors.    Loleta RoseForbach, Severina Sykora, MD 02/10/18 0800

## 2018-02-10 NOTE — ED Triage Notes (Signed)
Pt arrives with left sided chest pain with radiation to back and left arm. Pt states was dizzy, and was sweaty. Pt states she took 3x325mg  asa.

## 2018-03-06 ENCOUNTER — Emergency Department
Admission: EM | Admit: 2018-03-06 | Discharge: 2018-03-06 | Disposition: A | Discharge status: Home / Self Care | Payer: Self-pay | Attending: Emergency Medicine | Admitting: Emergency Medicine

## 2018-03-06 ENCOUNTER — Emergency Department: Payer: Self-pay

## 2018-03-06 ENCOUNTER — Encounter: Payer: Self-pay | Admitting: Emergency Medicine

## 2018-03-06 ENCOUNTER — Other Ambulatory Visit: Payer: Self-pay

## 2018-03-06 DIAGNOSIS — R079 Chest pain, unspecified: Secondary | ICD-10-CM | POA: Insufficient documentation

## 2018-03-06 DIAGNOSIS — F419 Anxiety disorder, unspecified: Secondary | ICD-10-CM | POA: Insufficient documentation

## 2018-03-06 DIAGNOSIS — F17210 Nicotine dependence, cigarettes, uncomplicated: Secondary | ICD-10-CM | POA: Insufficient documentation

## 2018-03-06 LAB — CBC
HCT: 34.6 % — ABNORMAL LOW (ref 36.0–46.0)
HEMOGLOBIN: 10.6 g/dL — AB (ref 12.0–15.0)
MCH: 24.3 pg — AB (ref 26.0–34.0)
MCHC: 30.6 g/dL (ref 30.0–36.0)
MCV: 79.4 fL — ABNORMAL LOW (ref 80.0–100.0)
NRBC: 0 % (ref 0.0–0.2)
PLATELETS: 252 10*3/uL (ref 150–400)
RBC: 4.36 MIL/uL (ref 3.87–5.11)
RDW: 15.5 % (ref 11.5–15.5)
WBC: 8.9 10*3/uL (ref 4.0–10.5)

## 2018-03-06 LAB — BASIC METABOLIC PANEL
Anion gap: 9 (ref 5–15)
BUN: 12 mg/dL (ref 6–20)
CALCIUM: 8.7 mg/dL — AB (ref 8.9–10.3)
CO2: 23 mmol/L (ref 22–32)
Chloride: 107 mmol/L (ref 98–111)
Creatinine, Ser: 0.93 mg/dL (ref 0.44–1.00)
Glucose, Bld: 115 mg/dL — ABNORMAL HIGH (ref 70–99)
POTASSIUM: 3.3 mmol/L — AB (ref 3.5–5.1)
SODIUM: 139 mmol/L (ref 135–145)

## 2018-03-06 LAB — POCT PREGNANCY, URINE: PREG TEST UR: NEGATIVE

## 2018-03-06 LAB — TROPONIN I

## 2018-03-06 MED ORDER — HYDROXYZINE HCL 25 MG PO TABS: 25.0000 mg | ORAL_TABLET | Freq: Three times a day (TID) | ORAL | 0 refills | Status: DC | PRN

## 2018-03-06 MED ORDER — HYDROXYZINE HCL 25 MG PO TABS
25.0000 mg | ORAL_TABLET | Freq: Once | ORAL | Status: AC
  Administered 2018-03-06: 25 mg via ORAL
  Filled 2018-03-06: qty 1

## 2018-03-06 NOTE — ED Triage Notes (Signed)
Pt to ED from home c/o central and left chest pain that started today radiating to back and generalized tingling feeling and palpitations, states lightheaded but no syncope.  Pain is sharp.  States had 2 12oz and 1 8oz redbull today.

## 2018-03-06 NOTE — ED Provider Notes (Signed)
[facility] Emergency Department Provider Note  Time seen: 4:39 PM  I have reviewed the triage vital signs and the nursing notes.   HISTORY  Chief Complaint Chest Pain    HPI Debbie Padilla is a 32 y.o. female with a past medical history of anxiety, bipolar, hypertension, presents to the emergency department for palpitations and chest pain.  According to the patient for the past several weeks she has been experiencing chest discomfort described mostly as a burning sensation, intermittent palpitations and shortness of breath.  Took Prilosec and states all of her symptoms resolved, has been taking Prilosec over the past week or so and has been symptom-free.  States she stopped it 2 days ago because she ran out, has since gotten more but has not restarted taking it.  She also states she stopped drinking red bull 2 weeks ago.  States today she drank 3 red bowls this morning and began having palpitations.  Admits to being very anxious and states she was having a panic attack was having some pain in her left shoulder at the time as well although denies any shortness of breath at any point.  Denies any diaphoresis or nausea.  Denies any leg pain or swelling.  No history of DVT.  Denies any pleuritic pain now or at any point.  Does not take any estrogen supplements or birth control (status post tubal ligation).  Patient became concerned so she came to the emergency department for evaluation.   Past Medical History:  Diagnosis Date  . Anxiety   . Bipolar disorder (HCC)   . Depression   . Hypertension    during pregnancy  . Preterm labor     Patient Active Problem List   Diagnosis Date Noted  . Gestational thrombocytopenia (HCC) 01/01/2016  . Abdominal pain 12/31/2015  . Active labor at term 12/31/2015  . Positive GBS test 12/27/2015  . Bipolar disorder (HCC) 12/07/2015  . H/O premature rupture of membranes in previous pregnancy, currently pregnant 11/10/2015   . H/O preterm delivery, currently pregnant 11/10/2015  . Prenatal care insufficient 11/10/2015  . Short interval between pregnancies affecting pregnancy in third trimester, antepartum 11/10/2015  . Abnormal drug screen 09/27/2015  . Tobacco abuse 10/06/2014    Past Surgical History:  Procedure Laterality Date  . ANKLE INCISION AND DRAINAGE Left July 2012  . DILATION AND CURETTAGE OF UTERUS    . TUBAL LIGATION Bilateral 01/01/2016   Procedure: POST PARTUM TUBAL LIGATION;  Surgeon: [name], MD;  Location: [facility] ORS;  Service: Gynecology;  Laterality: Bilateral;    Prior to Admission medications   Medication Sig Start Date End Date Taking? Authorizing Provider  HYDROcodone-acetaminophen (NORCO/VICODIN) 5-325 MG tablet Take 1 tablet by mouth every 6 (six) hours as needed for moderate pain. 10/02/17   Fisher, Roselyn Bering, PA-C  hydrocortisone (ANUSOL-HC) 25 MG suppository Place 1 suppository (25 mg total) rectally 2 (two) times daily. 10/02/17   Fisher, Roselyn Bering, PA-C  lidocaine (XYLOCAINE) 5 % ointment Apply 1 application topically as needed. 10/02/17   Sherrie Mustache Roselyn Bering, PA-C    No Known Allergies  Family History  Problem Relation Age of Onset  . Asthma Daughter   . Asthma Paternal Grandmother   . Diabetes Paternal Grandmother   . Cancer Neg Hx   . Heart disease Neg Hx     Social History Social History   Tobacco Use  . Smoking status: Current Every Day Smoker    Packs/day: 0.50  Years: 11.00    Pack years: 5.50    Types: Cigarettes  . Smokeless tobacco: Never Used  Substance Use Topics  . Alcohol use: Yes    Comment: rarely  . Drug use: Not Currently    Comment: States was taking it for appetite but has stopped since 1 month    Review of Systems Constitutional: Negative for fever. Cardiovascular: Positive for palpitations, pain to the back of the chest and left shoulder has since resolved.  Chest pain-free currently. Respiratory: Negative for shortness of  breath. Gastrointestinal: Negative for abdominal pain, vomiting Genitourinary: Negative for urinary compaints Musculoskeletal: Negative for musculoskeletal complaints Skin: Negative for skin complaints  Neurological: Negative for headache All other ROS negative  ____________________________________________   PHYSICAL EXAM:  VITAL SIGNS: ED Triage Vitals  Enc Vitals Group     BP 03/06/18 1508 (!) 156/81     Pulse Rate 03/06/18 1508 (!) 105     Resp 03/06/18 1508 18     Temp 03/06/18 1508 98.7 F (37.1 C)     Temp Source 03/06/18 1508 Oral     SpO2 03/06/18 1508 100 %     Weight 03/06/18 1502 140 lb (63.5 kg)     Height 03/06/18 1502 5\' 2"  (1.575 m)     Head Circumference --      Peak Flow --      Pain Score 03/06/18 1502 1     Pain Loc --      Pain Edu? --      Excl. in GC? --    Constitutional: Alert and oriented. Well appearing and in no distress. Eyes: Normal exam ENT   Head: Normocephalic and atraumatic.   Mouth/Throat: Mucous membranes are moist. Cardiovascular: Normal rate, regular rhythm. No murmur Respiratory: Normal respiratory effort without tachypnea nor retractions. Breath sounds are clear  Gastrointestinal: Soft and nontender. No distention.  Musculoskeletal: Nontender with normal range of motion in all extremities.  No lower extremity tenderness or edema. Neurologic:  Normal speech and language. No gross focal neurologic deficits  Skin:  Skin is warm, dry and intact.  Psychiatric: Mood and affect are normal. Speech and behavior are normal.   ____________________________________________    EKG  EKG reviewed and interpreted by myself shows sinus tachycardia 115 bpm with a narrow QRS, normal axis, normal intervals, nonspecific ST changes.  ____________________________________________    RADIOLOGY  X-ray is clear  ____________________________________________   INITIAL IMPRESSION / ASSESSMENT AND PLAN / ED COURSE  Pertinent labs & imaging  results that were available during my care of the patient were reviewed by me and considered in my medical decision making (see chart for details).  Patient presents to the emergency department for palpitations and chest discomfort which is since resolved.  Patient states it has been 2 weeks since he drank a red bull and drank 3 red bulls this morning.  States he began feeling palpitations and had a "panic attack".  States she was feeling discomfort in her left back and left shoulder.  Patient states since arriving here she feels much better.  States her symptoms have gone away.  She admits that she believes most of her symptoms are due to anxiety.  Denies any symptoms at this time.  Was initially tachycardic, on EKG she is 115 bpm, last vital she was 105 during my auscultation patient is around 90 bpm.  We will try hydroxyzine for the patient.  I discussed with the patient if her symptoms return any chest pain return  she can return to the emergency department for evaluation.  Otherwise I will refer her to her primary care doctor.  Patient agreeable to plan of care.  Symptoms are very suggestive of anxiety.  If the patient is mildly tachycardic she has 100% room air saturation normal respiratory rate, reassuring blood pressure off any estrogen therapy, no history of DVT, no suspicion for PE.  ____________________________________________   FINAL CLINICAL IMPRESSION(S) / ED DIAGNOSES  Chest pain Anxiety    Minna Antis, MD 03/06/18 1645

## 2018-07-03 ENCOUNTER — Emergency Department
Admission: EM | Admit: 2018-07-03 | Discharge: 2018-07-03 | Disposition: A | Discharge status: Home / Self Care | Payer: Self-pay | Attending: Emergency Medicine | Admitting: Emergency Medicine

## 2018-07-03 ENCOUNTER — Other Ambulatory Visit: Payer: Self-pay

## 2018-07-03 ENCOUNTER — Encounter: Payer: Self-pay | Admitting: Emergency Medicine

## 2018-07-03 DIAGNOSIS — F17210 Nicotine dependence, cigarettes, uncomplicated: Secondary | ICD-10-CM | POA: Insufficient documentation

## 2018-07-03 DIAGNOSIS — K029 Dental caries, unspecified: Secondary | ICD-10-CM

## 2018-07-03 DIAGNOSIS — K047 Periapical abscess without sinus: Secondary | ICD-10-CM | POA: Insufficient documentation

## 2018-07-03 MED ORDER — AMOXICILLIN 500 MG PO CAPS: 500.0000 mg | ORAL_CAPSULE | Freq: Three times a day (TID) | ORAL | 0 refills | Status: DC

## 2018-07-03 MED ORDER — LIDOCAINE VISCOUS HCL 2 % MT SOLN
15.0000 mL | Freq: Once | OROMUCOSAL | Status: AC
  Administered 2018-07-03: 15 mL via OROMUCOSAL
  Filled 2018-07-03: qty 15

## 2018-07-03 MED ORDER — LIDOCAINE VISCOUS HCL 2 % MT SOLN: OROMUCOSAL | 0 refills | Status: DC

## 2018-07-03 NOTE — ED Notes (Signed)
Pt c/o toothache 10/10 on L/side of face that started last night; pt "I took 600 mg ibuprofen, tylenol, naproxen" w/o relief. NAD noted at this time. This RN will continue to monitor.

## 2018-07-03 NOTE — ED Provider Notes (Addendum)
[facility] Emergency Department Provider Note   ____________________________________________   First MD Initiated Contact with Patient 07/03/18 1119     (approximate)  I have reviewed the triage vital signs and the nursing notes.   HISTORY  Chief Complaint Dental Pain   HPI Debbie Padilla is a 33 y.o. female   presents to the ED with complaint of both upper and lower dental pain on the left side of her face.  Patient states that all started last evening.  She reports taking ibuprofen, Tylenol and naproxen without any relief.  Patient states that this morning she called a dentist and has an appointment next week.  She rates her pain as a 10/10.   Past Medical History:  Diagnosis Date  . Anxiety   . Bipolar disorder (HCC)   . Depression   . Hypertension    during pregnancy  . Preterm labor     Patient Active Problem List   Diagnosis Date Noted  . Gestational thrombocytopenia (HCC) 01/01/2016  . Abdominal pain 12/31/2015  . Active labor at term 12/31/2015  . Positive GBS test 12/27/2015  . Bipolar disorder (HCC) 12/07/2015  . H/O premature rupture of membranes in previous pregnancy, currently pregnant 11/10/2015  . H/O preterm delivery, currently pregnant 11/10/2015  . Prenatal care insufficient 11/10/2015  . Short interval between pregnancies affecting pregnancy in third trimester, antepartum 11/10/2015  . Abnormal drug screen 09/27/2015  . Tobacco abuse 10/06/2014    Past Surgical History:  Procedure Laterality Date  . ANKLE INCISION AND DRAINAGE Left July 2012  . DILATION AND CURETTAGE OF UTERUS    . TUBAL LIGATION Bilateral 01/01/2016   Procedure: POST PARTUM TUBAL LIGATION;  Surgeon: Hildred LaserAnika Cherry, MD;  Location: [facility] ORS;  Service: Gynecology;  Laterality: Bilateral;    Prior to Admission medications   Medication Sig Start Date End Date Taking? Authorizing Provider  amoxicillin (AMOXIL) 500 MG capsule Take 1 capsule (500 mg  total) by mouth 3 (three) times daily. 07/03/18   Tommi RumpsSummers, Blessings Inglett L, PA-C  lidocaine (XYLOCAINE) 2 % solution Apply solution to a cotton ball and applied to tooth ache every 4 hours as needed. 07/03/18   Tommi RumpsSummers, China Deitrick L, PA-C    Allergies Patient has no known allergies.  Family History  Problem Relation Age of Onset  . Asthma Daughter   . Asthma Paternal Grandmother   . Diabetes Paternal Grandmother   . Cancer Neg Hx   . Heart disease Neg Hx     Social History Social History   Tobacco Use  . Smoking status: Current Every Day Smoker    Packs/day: 0.50    Years: 11.00    Pack years: 5.50    Types: Cigarettes  . Smokeless tobacco: Never Used  Substance Use Topics  . Alcohol use: Yes    Comment: rarely  . Drug use: Not Currently    Comment: States was taking it for appetite but has stopped since 1 month    Review of Systems Constitutional: No fever/chills Eyes: No visual changes. ENT: No sore throat.  Positive for dental pain. Cardiovascular: Denies chest pain. Respiratory: Denies shortness of breath. Neurological: Negative for  focal weakness or numbness. ____________________________________________   PHYSICAL EXAM:  VITAL SIGNS: ED Triage Vitals  Enc Vitals Group     BP 07/03/18 1048 (!) 160/104     Pulse Rate 07/03/18 1048 82     Resp 07/03/18 1048 14     Temp 07/03/18 1048 99 F (37.2  C)     Temp Source 07/03/18 1048 Oral     SpO2 07/03/18 1048 100 %     Weight 07/03/18 1046 152 lb (68.9 kg)     Height 07/03/18 1046 5\' 2"  (1.575 m)     Head Circumference --      Peak Flow --      Pain Score 07/03/18 1045 10     Pain Loc --      Pain Edu? --      Excl. in GC? --    Constitutional: Alert and oriented. Well appearing and in no acute distress. Eyes: Conjunctivae are normal.  Head: Atraumatic. Nose: No congestion/rhinnorhea. Mouth/Throat: Mucous membranes are moist.  Oropharynx non-erythematous.  Left upper and lower molars is in very poor dental repair  with both below the gumline.  Gum is edematous.  Moderately tender to touch.  No active drainage is noted.  Neck: No stridor.   Hematological/Lymphatic/Immunilogical: No cervical lymphadenopathy. Cardiovascular: Normal rate, regular rhythm. Grossly normal heart sounds.  Good peripheral circulation. Respiratory: Normal respiratory effort.  No retractions. Lungs CTAB. Musculoskeletal: Moves upper and lower extremities without any difficulty.  Normal gait was noted. Neurologic:  Normal speech and language. No gross focal neurologic deficits are appreciated.  Skin:  Skin is warm, dry and intact.  Psychiatric: Mood and affect are normal. Speech and behavior are normal.  ____________________________________________   LABS (all labs ordered are listed, but only abnormal results are displayed)  Labs Reviewed - No data to display  PROCEDURES  Procedure(s) performed: None  Procedures  Critical Care performed: No  ____________________________________________   INITIAL IMPRESSION / ASSESSMENT AND PLAN / ED COURSE  As part of my medical decision making, I reviewed the following data within the electronic MEDICAL RECORD NUMBER Notes from prior ED visits and [place] Controlled Substance Database  Patient presents to the ED with complaint of dental pain.  Patient states that pain started last evening.  Patient has been seen in the past for dental pain on the left side as she is now experiencing.  Viscous lidocaine was applied to a cottonball and placed in the patient's mouth.  She also has a prescription for amoxicillin 500 mg 3 times daily for 10 days along with a prescription for viscous lidocaine.  Patient was given a list of dental clinics including [facility] who has walk-in hours posted on a separate piece of paper.  She was made aware that she could take this medication with her present pain medication Buprenorphine 8 MG Tablet SL which was not listed with her present  medications.  ____________________________________________   FINAL CLINICAL IMPRESSION(S) / ED DIAGNOSES  Final diagnoses:  Pain due to dental caries  Dental abscess     ED Discharge Orders         Ordered    amoxicillin (AMOXIL) 500 MG capsule  3 times daily     07/03/18 1147    lidocaine (XYLOCAINE) 2 % solution     07/03/18 1213           Note:  This document was prepared using Dragon voice recognition software and may include unintentional dictation errors.    Tommi RumpsSummers, Erika Slaby L, PA-C 07/03/18 1215    Tommi RumpsSummers, Margart Zemanek L, PA-C 07/03/18 1217    Nita SickleVeronese, [place], MD 07/06/18 [phone]

## 2018-07-03 NOTE — ED Triage Notes (Signed)
Toothache that started yesterday, NAD.

## 2018-07-03 NOTE — Discharge Instructions (Addendum)
Follow-up with your dentist that you have an appointment with next week or consider going to 1 of the clinics listed on your discharge papers.  Also there is information on a separate sheet about the walk-in dental clinic at [facility] and also in [place].  That hours are listed on your separate paper and also [place] takes walk-ins on Friday.  Take medication as directed.  You may also take Tylenol or ibuprofen along with your regular medication Buprenorphine 8 MG Tablet SL  OPTIONS FOR DENTAL FOLLOW UP CARE  [place] Department of Health and Human Services - Local Safety Net Dental Clinics [email or web address]://www.ncdhhs.gov/dph/oralhealth/services/safetynetclinics.htm   [facility] [phone])  [facility] [phone])  [facility] [place] [phone])  [facility] [phone])  [facility] [phone]) This clinic caters to the indigent population and is on a lottery system. Location: [employer], [address], [place] Clinic Hours: Wednesdays from 6pm - 9pm, patients seen by a lottery system. For dates, call or go to [email or web address] Services: Cleanings, fillings and simple extractions. Payment Options: DENTAL WORK IS FREE OF CHARGE. Bring proof of income or support. Best way to get seen: Arrive at 5:15 pm - this is a lottery, NOT first come/first serve, so arriving earlier will not increase your chances of being seen.     [facility] [phone] Select option 1 for emergencies   Location: [facility], [place], [address], [place] Clinic Hours: No walk-ins accepted - call the day before to schedule an appointment. Check in times are 9:30 am and 1:30 pm. Services: Simple extractions, temporary fillings, pulpectomy/pulp debridement, uncomplicated abscess drainage. Payment Options: PAYMENT IS DUE AT THE  TIME OF SERVICE.  Fee is usually $100-200, additional surgical procedures (e.g. abscess drainage) may be extra. Cash, checks, Visa/MasterCard accepted.  Can file Medicaid if patient is covered for dental - patient should call case worker to check. No discount for [facility] patients. Best way to get seen: MUST call the day before and get onto the schedule. Can usually be seen the next 1-2 days. No walk-ins accepted.     [facility] [phone]   Location: [facility], [address], [place] Clinic Hours: M, W, Th, F 8am or 1:30pm, Tues 9a or 1:30 - first come/first served. Services: Simple extractions, temporary fillings, uncomplicated abscess drainage.  You do not need to be an [facility] [place] [place] resident. Payment Options: PAYMENT IS DUE AT THE TIME OF SERVICE. Dental insurance, otherwise sliding scale - bring proof of income or support. Depending on income and treatment needed, cost is usually $50-200. Best way to get seen: Arrive early as it is first come/first served.     [facility] [phone]   Location: [address] Clinic Hours: Mon-Thu 8a-5p Services: Most basic dental services including extractions and fillings. Payment Options: PAYMENT IS DUE AT THE TIME OF SERVICE. Sliding scale, up to 50% off - bring proof if income or support. Medicaid with dental option accepted. Best way to get seen: Call to schedule an appointment, can usually be seen within 2 weeks OR they will try to see walk-ins - show up at 8a or 2p (you may have to wait).     [facility] [phone] [place] RESIDENTS ONLY   Location: [facility], [address], [place], [place] [address] Clinic Hours: By appointment only. Monday -  Thursday 8am-5pm, Friday 8am-12pm Services: Cleanings, fillings, extractions. Payment Options: PAYMENT IS DUE AT THE TIME OF  SERVICE. Cash, Visa or MasterCard. Sliding scale - $30 minimum per service. Best way to get seen: Come in to office, complete packet and make an appointment - need proof of income or support monies for each household member and proof of [facility] residence. Usually takes about a month to get in.     [facility] [phone]   Location: [address], [facility] Clinic Hours: Walk-in Urgent Care Dental Services are offered Monday-Friday mornings only. The numbers of emergencies accepted daily is limited to the number of providers available. Maximum 15 - Mondays, Wednesdays & Thursdays Maximum 10 - Tuesdays & Fridays Services: You do not need to be a [facility] resident to be seen for a dental emergency. Emergencies are defined as pain, swelling, abnormal bleeding, or dental trauma. Walkins will receive x-rays if needed. NOTE: Dental cleaning is not an emergency. Payment Options: PAYMENT IS DUE AT THE TIME OF SERVICE. Minimum co-pay is $40.00 for uninsured patients. Minimum co-pay is $3.00 for Medicaid with dental coverage. Dental Insurance is accepted and must be presented at time of visit. Medicare does not cover dental. Forms of payment: Cash, credit card, checks. Best way to get seen: If not previously registered with the clinic, walk-in dental registration begins at 7:15 am and is on a first come/first serve basis. If previously registered with the clinic, call to make an appointment.     [facility] [phone] [place] RESIDENTS ONLY   Location: [address] [place], [place] Clinic Hours: Mon-Thu 10a-2p Services: Extractions only! Payment Options: FREE (donations accepted) - bring proof of income or support Best way to get seen: Call and schedule an appointment OR come at 8am on the 1st Monday of every month (except for holidays) when it is first come/first served.     [facility] [phone]    Location: [address] [place], [place] Clinic Hours: Friday mornings Services, Payment Options, Best way to get seen: Call for info

## 2018-08-30 ENCOUNTER — Emergency Department: Payer: Self-pay

## 2018-08-30 ENCOUNTER — Emergency Department
Admission: EM | Admit: 2018-08-30 | Discharge: 2018-08-30 | Disposition: A | Discharge status: Home / Self Care | Payer: Self-pay | Attending: Emergency Medicine | Admitting: Emergency Medicine

## 2018-08-30 ENCOUNTER — Other Ambulatory Visit: Payer: Self-pay

## 2018-08-30 ENCOUNTER — Encounter: Payer: Self-pay | Admitting: Emergency Medicine

## 2018-08-30 DIAGNOSIS — Y929 Unspecified place or not applicable: Secondary | ICD-10-CM | POA: Insufficient documentation

## 2018-08-30 DIAGNOSIS — Y9389 Activity, other specified: Secondary | ICD-10-CM | POA: Insufficient documentation

## 2018-08-30 DIAGNOSIS — F17210 Nicotine dependence, cigarettes, uncomplicated: Secondary | ICD-10-CM | POA: Insufficient documentation

## 2018-08-30 DIAGNOSIS — S62610A Displaced fracture of proximal phalanx of right index finger, initial encounter for closed fracture: Secondary | ICD-10-CM | POA: Insufficient documentation

## 2018-08-30 DIAGNOSIS — Y999 Unspecified external cause status: Secondary | ICD-10-CM | POA: Insufficient documentation

## 2018-08-30 MED ORDER — OXYCODONE-ACETAMINOPHEN 5-325 MG PO TABS
1.0000 | ORAL_TABLET | Freq: Once | ORAL | Status: AC
  Administered 2018-08-30: 1 via ORAL
  Filled 2018-08-30: qty 1
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
## 2018-08-30 NOTE — Discharge Instructions (Addendum)
Please go to Dr. Neomia Glass office on Monday morning at 9:30 AM.  He is planning to fix your finger on Monday.  Please wear splint until you see Dr. Rosita Kea.  Do not eat or drink anything after 12AM on Monday.
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
## 2018-09-01 MED ORDER — CEFAZOLIN SODIUM-DEXTROSE 2-4 GM/100ML-% IV SOLN
2.0000 g | Freq: Once | INTRAVENOUS | Status: AC
  Administered 2018-09-02: 2 g via INTRAVENOUS

## 2018-09-02 ENCOUNTER — Ambulatory Visit
Admission: RE | Admit: 2018-09-02 | Discharge: 2018-09-02 | Disposition: A | Discharge status: Home / Self Care | Payer: Self-pay | Attending: Orthopedic Surgery | Admitting: Orthopedic Surgery

## 2018-09-02 ENCOUNTER — Other Ambulatory Visit: Payer: Self-pay

## 2018-09-02 ENCOUNTER — Ambulatory Visit: Payer: Self-pay | Admitting: Anesthesiology

## 2018-09-02 ENCOUNTER — Encounter
Admission: RE | Disposition: A | Discharge status: Home / Self Care | Payer: Self-pay | Source: Home / Self Care | Attending: Orthopedic Surgery

## 2018-09-02 DIAGNOSIS — X58XXXA Exposure to other specified factors, initial encounter: Secondary | ICD-10-CM | POA: Insufficient documentation

## 2018-09-02 DIAGNOSIS — S62610A Displaced fracture of proximal phalanx of right index finger, initial encounter for closed fracture: Secondary | ICD-10-CM | POA: Insufficient documentation

## 2018-09-02 DIAGNOSIS — F17200 Nicotine dependence, unspecified, uncomplicated: Secondary | ICD-10-CM | POA: Insufficient documentation

## 2018-09-02 HISTORY — PX: OPEN REDUCTION INTERNAL FIXATION (ORIF) PROXIMAL PHALANX: SHX6235

## 2018-09-02 LAB — URINE DRUG SCREEN, QUALITATIVE (ARMC ONLY)
Amphetamines, Ur Screen: NOT DETECTED
Barbiturates, Ur Screen: NOT DETECTED
Benzodiazepine, Ur Scrn: NOT DETECTED
Cannabinoid 50 Ng, Ur ~~LOC~~: POSITIVE — AB
Cocaine Metabolite,Ur ~~LOC~~: NOT DETECTED
MDMA (Ecstasy)Ur Screen: NOT DETECTED
Methadone Scn, Ur: NOT DETECTED
Opiate, Ur Screen: NOT DETECTED
Phencyclidine (PCP) Ur S: NOT DETECTED
Tricyclic, Ur Screen: NOT DETECTED

## 2018-09-02 LAB — POCT PREGNANCY, URINE: Preg Test, Ur: NEGATIVE

## 2018-09-02 SURGERY — OPEN REDUCTION INTERNAL FIXATION (ORIF) PROXIMAL PHALANX
Anesthesia: General | Site: Index Finger | Laterality: Right

## 2018-09-02 MED ORDER — FENTANYL CITRATE (PF) 100 MCG/2ML IJ SOLN
INTRAMUSCULAR | Status: AC
  Filled 2018-09-02: qty 2

## 2018-09-02 MED ORDER — ONDANSETRON HCL 4 MG/2ML IJ SOLN: 4.0000 mg | Freq: Four times a day (QID) | INTRAMUSCULAR | Status: DC | PRN

## 2018-09-02 MED ORDER — DEXMEDETOMIDINE HCL 200 MCG/2ML IV SOLN
INTRAVENOUS | Status: DC | PRN
  Administered 2018-09-02 (×5): 4 ug via INTRAVENOUS

## 2018-09-02 MED ORDER — SEVOFLURANE IN SOLN
RESPIRATORY_TRACT | Status: AC
  Filled 2018-09-02: qty 250

## 2018-09-02 MED ORDER — SODIUM CHLORIDE 0.9 % IV SOLN
INTRAVENOUS | Status: DC | PRN
  Administered 2018-09-02: 75 mL

## 2018-09-02 MED ORDER — FENTANYL CITRATE (PF) 100 MCG/2ML IJ SOLN: 25.0000 ug | INTRAMUSCULAR | Status: DC | PRN

## 2018-09-02 MED ORDER — LACTATED RINGERS IV SOLN
INTRAVENOUS | Status: DC
  Administered 2018-09-02 (×2): via INTRAVENOUS

## 2018-09-02 MED ORDER — GENTAMICIN SULFATE 40 MG/ML IJ SOLN
INTRAMUSCULAR | Status: AC
  Filled 2018-09-02: qty 2

## 2018-09-02 MED ORDER — METOCLOPRAMIDE HCL 10 MG PO TABS: 5.0000 mg | ORAL_TABLET | Freq: Three times a day (TID) | ORAL | Status: DC | PRN

## 2018-09-02 MED ORDER — FENTANYL CITRATE (PF) 100 MCG/2ML IJ SOLN
INTRAMUSCULAR | Status: DC | PRN
  Administered 2018-09-02: 25 ug via INTRAVENOUS
  Administered 2018-09-02 (×2): 50 ug via INTRAVENOUS
  Administered 2018-09-02: 25 ug via INTRAVENOUS

## 2018-09-02 MED ORDER — BUPIVACAINE HCL (PF) 0.5 % IJ SOLN
INTRAMUSCULAR | Status: AC
  Filled 2018-09-02: qty 30

## 2018-09-02 MED ORDER — MIDAZOLAM HCL 2 MG/2ML IJ SOLN
INTRAMUSCULAR | Status: AC
  Filled 2018-09-02: qty 2

## 2018-09-02 MED ORDER — HYDROCODONE-ACETAMINOPHEN 7.5-325 MG PO TABS: 1.0000 | ORAL_TABLET | ORAL | Status: DC | PRN

## 2018-09-02 MED ORDER — CEFAZOLIN SODIUM-DEXTROSE 2-4 GM/100ML-% IV SOLN
INTRAVENOUS | Status: AC
  Filled 2018-09-02: qty 100

## 2018-09-02 MED ORDER — HYDROCODONE-ACETAMINOPHEN 5-325 MG PO TABS: 1.0000 | ORAL_TABLET | Freq: Four times a day (QID) | ORAL | 0 refills | Status: DC | PRN

## 2018-09-02 MED ORDER — ONDANSETRON HCL 4 MG PO TABS: 4.0000 mg | ORAL_TABLET | Freq: Four times a day (QID) | ORAL | Status: DC | PRN

## 2018-09-02 MED ORDER — MORPHINE SULFATE (PF) 4 MG/ML IV SOLN: 0.5000 mg | INTRAVENOUS | Status: DC | PRN

## 2018-09-02 MED ORDER — DEXAMETHASONE SODIUM PHOSPHATE 10 MG/ML IJ SOLN
INTRAMUSCULAR | Status: DC | PRN
  Administered 2018-09-02 (×2): 10 mg via INTRAVENOUS

## 2018-09-02 MED ORDER — OXYCODONE HCL 5 MG PO TABS
5.0000 mg | ORAL_TABLET | Freq: Once | ORAL | Status: AC | PRN
  Administered 2018-09-02: 14:00:00 5 mg via ORAL

## 2018-09-02 MED ORDER — MIDAZOLAM HCL 2 MG/2ML IJ SOLN
INTRAMUSCULAR | Status: DC | PRN
  Administered 2018-09-02: 2 mg via INTRAVENOUS

## 2018-09-02 MED ORDER — ACETAMINOPHEN 325 MG PO TABS: 325.0000 mg | ORAL_TABLET | Freq: Four times a day (QID) | ORAL | Status: DC | PRN

## 2018-09-02 MED ORDER — LIDOCAINE HCL (CARDIAC) PF 100 MG/5ML IV SOSY
PREFILLED_SYRINGE | INTRAVENOUS | Status: DC | PRN
  Administered 2018-09-02: 40 mg via INTRAVENOUS
  Administered 2018-09-02: 60 mg via INTRAVENOUS

## 2018-09-02 MED ORDER — GABAPENTIN 300 MG PO CAPS: 300.0000 mg | ORAL_CAPSULE | Freq: Three times a day (TID) | ORAL | Status: DC

## 2018-09-02 MED ORDER — OXYCODONE HCL 5 MG PO TABS
ORAL_TABLET | ORAL | Status: AC
  Filled 2018-09-02: qty 1

## 2018-09-02 MED ORDER — SODIUM CHLORIDE 0.9 % IV SOLN: INTRAVENOUS | Status: DC

## 2018-09-02 MED ORDER — METOCLOPRAMIDE HCL 5 MG/ML IJ SOLN: 5.0000 mg | Freq: Three times a day (TID) | INTRAMUSCULAR | Status: DC | PRN

## 2018-09-02 MED ORDER — BUPIVACAINE HCL (PF) 0.5 % IJ SOLN
INTRAMUSCULAR | Status: DC | PRN
  Administered 2018-09-02: 10 mL

## 2018-09-02 MED ORDER — OXYCODONE HCL 5 MG/5ML PO SOLN: 5.0000 mg | Freq: Once | ORAL | Status: AC | PRN

## 2018-09-02 MED ORDER — PROPOFOL 10 MG/ML IV BOLUS
INTRAVENOUS | Status: DC | PRN
  Administered 2018-09-02: 120 mg via INTRAVENOUS
  Administered 2018-09-02: 80 mg via INTRAVENOUS

## 2018-09-02 MED ORDER — PROPOFOL 10 MG/ML IV BOLUS
INTRAVENOUS | Status: AC
  Filled 2018-09-02: qty 20

## 2018-09-02 MED ORDER — HYDROCODONE-ACETAMINOPHEN 5-325 MG PO TABS: 1.0000 | ORAL_TABLET | ORAL | Status: DC | PRN

## 2018-09-02 MED ORDER — ONDANSETRON HCL 4 MG/2ML IJ SOLN
INTRAMUSCULAR | Status: DC | PRN
  Administered 2018-09-02: 4 mg via INTRAVENOUS

## 2018-09-02 SURGICAL SUPPLY — 40 items
BANDAGE ELASTIC 4 LF NS (GAUZE/BANDAGES/DRESSINGS) ×3 IMPLANT
BIT DRILL 1.0 W/MINI QC (BIT) ×2 IMPLANT
BIT DRILL 1.0MM W/MINI QC (BIT) ×2
BIT DRILL 1.3 (BIT) ×2
BIT DRILL 1.3XMNQK CNCT DISP (BIT) IMPLANT
BIT DRL 1.3XMNQK CNCT DISP (BIT) ×1
BNDG GAUZE 1X2.1 STRL (MISCELLANEOUS) ×3 IMPLANT
CHLORAPREP W/TINT 26 (MISCELLANEOUS) ×3 IMPLANT
COVER WAND RF STERILE (DRAPES) ×3 IMPLANT
CUFF TOURN SGL QUICK 18X4 (TOURNIQUET CUFF) ×2 IMPLANT
DRAPE FLUOR MINI C-ARM 54X84 (DRAPES) ×3 IMPLANT
ELECT CAUTERY BLADE 6.4 (BLADE) ×3 IMPLANT
ELECT REM PT RETURN 9FT ADLT (ELECTROSURGICAL) ×3
ELECTRODE REM PT RTRN 9FT ADLT (ELECTROSURGICAL) ×1 IMPLANT
GAUZE SPONGE 4X4 12PLY STRL (GAUZE/BANDAGES/DRESSINGS) ×3 IMPLANT
GAUZE XEROFORM 1X8 LF (GAUZE/BANDAGES/DRESSINGS) ×3 IMPLANT
GLOVE SURG SYN 9.0  PF PI (GLOVE) ×6
GLOVE SURG SYN 9.0 PF PI (GLOVE) ×1 IMPLANT
GOWN SRG 2XL LVL 4 RGLN SLV (GOWNS) ×1 IMPLANT
GOWN STRL NON-REIN 2XL LVL4 (GOWNS) ×2
GOWN STRL REUS W/ TWL LRG LVL3 (GOWN DISPOSABLE) ×1 IMPLANT
GOWN STRL REUS W/TWL LRG LVL3 (GOWN DISPOSABLE) ×4
KIT TURNOVER KIT A (KITS) ×3 IMPLANT
NDL FILTER BLUNT 18X1 1/2 (NEEDLE) ×1 IMPLANT
NEEDLE FILTER BLUNT 18X 1/2SAF (NEEDLE) ×2
NEEDLE FILTER BLUNT 18X1 1/2 (NEEDLE) ×1 IMPLANT
NS IRRIG 500ML POUR BTL (IV SOLUTION) ×3 IMPLANT
PACK EXTREMITY ARMC (MISCELLANEOUS) ×3 IMPLANT
PAD PREP 24X41 OB/GYN DISP (PERSONAL CARE ITEMS) ×3 IMPLANT
PADDING CAST BLEND 4X4 NS (MISCELLANEOUS) ×3 IMPLANT
SCALPEL PROTECTED #15 DISP (BLADE) ×6 IMPLANT
SCREW 1.3X10MM (Screw) ×2 IMPLANT
SCREW 1.3X11MM (Screw) ×2 IMPLANT
SCREW BN 10X1.3XNONLOCK HND (Screw) IMPLANT
SCREW BN 11X1.3XNONLOCK HND (Screw) IMPLANT
SCREW NON LOCK 1.3X14MM (Screw) ×4 IMPLANT
SCREW NON LOCK 1.3X16MM (Screw) ×2 IMPLANT
SUT ETHIBOND 4-0 (SUTURE) ×3 IMPLANT
SUT ETHILON 5 0 CL P 3 (SUTURE) ×3 IMPLANT
SUT VIC AB 4-0 KS 27 (SUTURE) ×2 IMPLANT

## 2018-09-02 NOTE — Transfer of Care (Signed)
Immediate Anesthesia Transfer of Care Note  Patient: Debbie Padilla  Procedure(s) Performed: RIGHT OPEN REDUCTION INTERNAL FIXATION (ORIF) INDEX  PROXIMAL PHALANX (Right Index Finger)  Patient Location: PACU  Anesthesia Type:General  Level of Consciousness: sedated  Airway & Oxygen Therapy: Patient Spontanous Breathing and Patient connected to face mask oxygen  Post-op Assessment: Report given to RN and Post -op Vital signs reviewed and stable  Post vital signs: Reviewed and stable  Last Vitals:  Vitals Value Taken Time  BP 119/65 09/02/2018  1:11 PM  Temp    Pulse 71 09/02/2018  1:14 PM  Resp 17 09/02/2018  1:14 PM  SpO2 100 % 09/02/2018  1:14 PM  Vitals shown include unvalidated device data.  Last Pain:  Vitals:   09/02/18 1016  TempSrc: Temporal  PainSc: 7          Complications: No apparent anesthesia complications

## 2018-09-02 NOTE — Anesthesia Post-op Follow-up Note (Signed)
Anesthesia QCDR form completed.        
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
## 2018-09-02 NOTE — H&P (Signed)
Reviewed paper H+P, will be scanned into chart. No changes noted.  

## 2018-09-02 NOTE — Anesthesia Postprocedure Evaluation (Signed)
Anesthesia Post Note  Patient: Debbie Padilla  Procedure(s) Performed: RIGHT OPEN REDUCTION INTERNAL FIXATION (ORIF) INDEX  PROXIMAL PHALANX (Right Index Finger)  Patient location during evaluation: PACU Anesthesia Type: General Level of consciousness: awake and alert and oriented Pain management: pain level controlled Vital Signs Assessment: post-procedure vital signs reviewed and stable Respiratory status: spontaneous breathing, nonlabored ventilation and respiratory function stable Cardiovascular status: blood pressure returned to baseline and stable Postop Assessment: no signs of nausea or vomiting Anesthetic complications: no     Last Vitals:  Vitals:   09/02/18 1409 09/02/18 1411  BP:  122/80  Pulse: 83 76  Resp: 19 (!) 24  Temp: 36.9 C   SpO2: 100% 100%    Last Pain:  Vitals:   09/02/18 1357  TempSrc:   PainSc: 5                  Kayston Jodoin

## 2018-09-02 NOTE — Discharge Instructions (Signed)
Keep splint clean and dry.  Keep arm elevated is much as possible.  Pain medicine as directed.  AMBULATORY SURGERY  DISCHARGE INSTRUCTIONS   1) The drugs that you were given will stay in your system until tomorrow so for the next 24 hours you should not:  A) Drive an automobile B) Make any legal decisions C) Drink any alcoholic beverage   2) You may resume regular meals tomorrow.  Today it is better to start with liquids and gradually work up to solid foods.  You may eat anything you prefer, but it is better to start with liquids, then soup and crackers, and gradually work up to solid foods.   3) Please notify your doctor immediately if you have any unusual bleeding, trouble breathing, redness and pain at the surgery site, drainage, fever, or pain not relieved by medication.    4) Additional Instructions:        Please contact your physician with any problems or Same Day Surgery at [phone], Monday through Friday 6 am to 4 pm, or [place] at [facility] number at [phone].

## 2018-09-02 NOTE — Op Note (Signed)
09/02/2018  1:09 PM  PATIENT:  Debbie Padilla  33 y.o. female  PRE-OPERATIVE DIAGNOSIS:  CLOSED DISPLACED FRACTURE OF PROXIMAL PHALANX RIGHT INDEX  POST-OPERATIVE DIAGNOSIS:  CLOSED DISPLACED FRACTURE OF PROXIMAL   PROCEDURE:  Procedure(s): RIGHT OPEN REDUCTION INTERNAL FIXATION (ORIF) INDEX  PROXIMAL PHALANX (Right)  SURGEON: Leitha Schuller, MD  ASSISTANTS: None  ANESTHESIA:   general  EBL:  Total I/O In: 400 [I.V.:400] Out: -   BLOOD ADMINISTERED:none  DRAINS: none   LOCAL MEDICATIONS USED:  MARCAINE     SPECIMEN:  No Specimen  DISPOSITION OF SPECIMEN:  N/A  COUNTS:  YES  TOURNIQUET:   77 minutes at 250 mmHg  IMPLANTS: Biomet 1.3 millimeter screw x2  DICTATION: .Dragon Dictation patient was brought to the operating room and after adequate general anesthesia was obtained the right arm was prepped and draped in the usual sterile fashion with a tourniquet applied to the upper arm.  After patient identification and timeout procedures were completed, tourniquet was raised.  Approach was made in the mid axial incision on the radial side of the finger getting exposure of the distal half of the proximal phalanx a small incision was made on the ulnar side and a clamp placed to try to get acceptable alignment is quite difficult to get adequate reduction of the could not be repaired subsequently a second ulnar-sided incision was made to get the large ulnar-sided fragment adequately reduced and when this finally was done it was possible to put 2 leg screws from radial to ulnar side getting acceptable alignment in both AP and lateral projections full range of motion of the finger and no rotatory deformity.  After after the though that was done with over drilling with a 1.3 drill and then using the 1.1 drill on the distal cortex the screws appeared to give rigid fixation through range of motion under fluoroscopic view on both AP and lateral imaging.  The wound was thoroughly  irrigated and wounds closed with simple interrupted and running 5-0 nylon skin suture followed by Xeroform 4 x 4's web roll and a dorsal splint holding the fingers in flexion.  Tourniquet is let down at the close of the case a digital block was placed at the start of the case to aid in postop analgesia with 10 cc of half percent Sensorcaine plain.  PLAN OF CARE: Discharge to home after PACU  PATIENT DISPOSITION:  PACU - hemodynamically stable.

## 2018-09-03 ENCOUNTER — Encounter: Payer: Self-pay | Admitting: Orthopedic Surgery

## 2019-03-11 ENCOUNTER — Other Ambulatory Visit: Payer: Self-pay

## 2019-03-11 ENCOUNTER — Encounter: Payer: Self-pay | Admitting: Emergency Medicine

## 2019-03-11 ENCOUNTER — Emergency Department: Payer: Self-pay

## 2019-03-11 ENCOUNTER — Emergency Department
Admission: EM | Admit: 2019-03-11 | Discharge: 2019-03-11 | Disposition: A | Discharge status: Home / Self Care | Payer: Self-pay | Attending: Emergency Medicine | Admitting: Emergency Medicine

## 2019-03-11 DIAGNOSIS — D509 Iron deficiency anemia, unspecified: Secondary | ICD-10-CM | POA: Insufficient documentation

## 2019-03-11 DIAGNOSIS — F1210 Cannabis abuse, uncomplicated: Secondary | ICD-10-CM | POA: Insufficient documentation

## 2019-03-11 DIAGNOSIS — R0789 Other chest pain: Secondary | ICD-10-CM | POA: Insufficient documentation

## 2019-03-11 DIAGNOSIS — F17210 Nicotine dependence, cigarettes, uncomplicated: Secondary | ICD-10-CM | POA: Insufficient documentation

## 2019-03-11 DIAGNOSIS — I10 Essential (primary) hypertension: Secondary | ICD-10-CM | POA: Insufficient documentation

## 2019-03-11 LAB — TROPONIN I (HIGH SENSITIVITY)
Troponin I (High Sensitivity): <2 ng/L (ref <18)
Troponin I (High Sensitivity): 3 ng/L (ref <18)

## 2019-03-11 LAB — BASIC METABOLIC PANEL
Anion gap: 9 (ref 5–15)
BUN: 10 mg/dL (ref 6–20)
CO2: 22 mmol/L (ref 22–32)
Calcium: 8.6 mg/dL — ABNORMAL LOW (ref 8.9–10.3)
Chloride: 109 mmol/L (ref 98–111)
Creatinine, Ser: 0.87 mg/dL (ref 0.44–1.00)
GFR calc Af Amer: >60 mL/min (ref >60)
GFR calc non Af Amer: >60 mL/min (ref >60)
Glucose, Bld: 135 mg/dL — ABNORMAL HIGH (ref 70–99)
Potassium: 3.9 mmol/L (ref 3.5–5.1)
Sodium: 140 mmol/L (ref 135–145)

## 2019-03-11 LAB — CBC
HCT: 25.4 % — ABNORMAL LOW (ref 36.0–46.0)
Hemoglobin: 7 g/dL — ABNORMAL LOW (ref 12.0–15.0)
MCH: 19.2 pg — ABNORMAL LOW (ref 26.0–34.0)
MCHC: 27.6 g/dL — ABNORMAL LOW (ref 30.0–36.0)
MCV: 69.6 fL — ABNORMAL LOW (ref 80.0–100.0)
Platelets: 261 10*3/uL (ref 150–400)
RBC: 3.65 MIL/uL — ABNORMAL LOW (ref 3.87–5.11)
RDW: 17.3 % — ABNORMAL HIGH (ref 11.5–15.5)
WBC: 6 10*3/uL (ref 4.0–10.5)
nRBC: 0 % (ref 0.0–0.2)

## 2019-03-11 LAB — FIBRIN DERIVATIVES D-DIMER (ARMC ONLY): Fibrin derivatives D-dimer (ARMC): 377.82 ng/mL (FEU) (ref 0.00–499.00)

## 2019-03-11 LAB — HCG, QUANTITATIVE, PREGNANCY: hCG, Beta Chain, Quant, S: <1 m[IU]/mL (ref <5)

## 2019-03-11 MED ORDER — DIPHENHYDRAMINE HCL 25 MG PO CAPS
25.0000 mg | ORAL_CAPSULE | Freq: Once | ORAL | Status: AC
  Administered 2019-03-11: 25 mg via ORAL
  Filled 2019-03-11: qty 1

## 2019-03-11 NOTE — ED Triage Notes (Signed)
Pt in via POV, reports sudden onset left side chest pain then becoming flushed w/ near syncopal episode.  Pt tachycardic upon arrival, other vitals WDL.  Pt reports hx anxiety.  NAD noted at this time.

## 2019-03-11 NOTE — ED Notes (Signed)
Pt states that she is unable to find a babysitter for her children tonight and will be unable to stay for blood transfusion. States she doesn't need to leave right now and can wait for blood test results but will have to come back tomorrow morning for a blood transfusion, states she was able to find a sitter for tomorrow, just not tonight. Will inform EDP.

## 2019-03-11 NOTE — ED Notes (Signed)
Pt appears anxious. States hasn't had anxiety since her 56's. States had sharp pinching L sided CP that began today. States it's intermittent. Denies any at current. States she took benadryl earlier "because my medical doctor said that would help with my anxiety." A&O. Worried about her gallbladder d/t family members having gallbladder issues.

## 2019-03-11 NOTE — ED Provider Notes (Signed)
[facility] Emergency Department Provider Note ____________________________________________   None    (approximate)  I have reviewed the triage vital signs and the nursing notes.  HISTORY  Chief Complaint Chest Pain and Anxiety  HPI Debbie Padilla is a 33 y.o. female who comes for evaluation  for feeling lightheaded and having intermittent chest pain for about a month  Patient reports that about a month ago, she was seen at an outside urgent care and she including her mother, children and herself all tested positive for coronavirus.  She has been feeling a bit more anxious since then, but has been experiencing intermittent left shoulder pain which seems to be present for months possibly attributed to a previously dislocated left shoulder from the distant past, she tells me that she also has been noticing that for the last several weeks she will feel quite lightheaded sometimes when standing, sometimes feels a little "foggy" when she is up moving around and walking.  She denies being in pain right now, reports she will have pain will seem to be in the left shoulder area not really in the chest, it lasts for sometimes short period sometimes a couple hours, and it is sharp and feeling.  She also reports she went to urgent care and they instructed her this was likely chronic shoulder pain, she also talked to a physician recommend she trial oral Benadryl for some of the anxiety.  Did not drive herself and would like to have on the oral Benadryl tablets here  No leg swelling.  No hospitalization.  No bloody cough.  No history of blood clots.  Does relate a history of heavy menstrual cycle sometimes going through several soaked pads a day for about 5 to 7 days with menstrual cycles regularly every 28 days.  Last menstrual cycle started end of the first week of October and has since stopped.  Past Medical History:  Diagnosis Date   Anxiety    Bipolar disorder  (HCC)    Depression    Hypertension    during pregnancy   Preterm labor     Patient Active Problem List   Diagnosis Date Noted   Gestational thrombocytopenia (HCC) 01/01/2016   Abdominal pain 12/31/2015   Active labor at term 12/31/2015   Positive GBS test 12/27/2015   Bipolar disorder (HCC) 12/07/2015   H/O premature rupture of membranes in previous pregnancy, currently pregnant 11/10/2015   H/O preterm delivery, currently pregnant 11/10/2015   Prenatal care insufficient 11/10/2015   Short interval between pregnancies affecting pregnancy in third trimester, antepartum 11/10/2015   Abnormal drug screen 09/27/2015   Tobacco abuse 10/06/2014    Past Surgical History:  Procedure Laterality Date   ANKLE INCISION AND DRAINAGE Left July 2012   DILATION AND CURETTAGE OF UTERUS     OPEN REDUCTION INTERNAL FIXATION (ORIF) PROXIMAL PHALANX Right 09/02/2018   Procedure: RIGHT OPEN REDUCTION INTERNAL FIXATION (ORIF) INDEX  PROXIMAL PHALANX;  Surgeon: Kennedy Bucker, MD;  Location: [facility] ORS;  Service: Orthopedics;  Laterality: Right;   TUBAL LIGATION Bilateral 01/01/2016   Procedure: POST PARTUM TUBAL LIGATION;  Surgeon: [name], MD;  Location: [facility] ORS;  Service: Gynecology;  Laterality: Bilateral;    Prior to Admission medications   Medication Sig Start Date End Date Taking? Authorizing Provider  amoxicillin (AMOXIL) 500 MG capsule Take 1 capsule (500 mg total) by mouth 3 (three) times daily. Patient not taking: Reported on 09/02/2018 07/03/18   Tommi Rumps, PA-C  HYDROcodone-acetaminophen [facility])  5-325 MG tablet Take 1 tablet by mouth every 6 (six) hours as needed. Patient not taking: Reported on 03/11/2019 09/02/18   Kennedy Bucker, MD  lidocaine (XYLOCAINE) 2 % solution Apply solution to a cotton ball and applied to tooth ache every 4 hours as needed. Patient not taking: Reported on 09/02/2018 07/03/18   Tommi Rumps, PA-C    Allergies Patient has no known  allergies.  Family History  Problem Relation Age of Onset   Asthma Daughter    Asthma Paternal Grandmother    Diabetes Paternal Grandmother    Cancer Neg Hx    Heart disease Neg Hx     Social History Social History   Tobacco Use   Smoking status: Current Every Day Smoker    Packs/day: 0.50    Years: 11.00    Pack years: 5.50    Types: Cigarettes   Smokeless tobacco: Never Used  Substance Use Topics   Alcohol use: Yes   Drug use: Yes    Types: Marijuana    Review of Systems Constitutional: No fever/chills but did have coronavirus test positive she reports about a month 3 weeks ago.  She also reports she was told that she is outside of the infection.,  But was instructed she would test positive for at least the next 90 or so days Eyes: No visual changes. ENT: No sore throat. Cardiovascular: See HPI Respiratory: Denies shortness of breath. Gastrointestinal: No abdominal pain.   Genitourinary: Negative for dysuria.  Denies pregnancy. Musculoskeletal: Negative for back pain. Skin: Negative for rash. Neurological: Negative for headaches, areas of focal weakness or numbness.    ____________________________________________   PHYSICAL EXAM:  VITAL SIGNS: ED Triage Vitals  Enc Vitals Group     BP 03/11/19 1425 (!) 155/90     Pulse Rate 03/11/19 1425 (!) 142     Resp 03/11/19 1425 16     Temp 03/11/19 1425 99.8 F (37.7 C)     Temp Source 03/11/19 1425 Oral     SpO2 03/11/19 1425 100 %     Weight 03/11/19 1428 151 lb (68.5 kg)     Height 03/11/19 1428 5\' 2"  (1.575 m)     Head Circumference --      Peak Flow --      Pain Score 03/11/19 1427 2     Pain Loc --      Pain Edu? --      Excl. in GC? --    Constitutional: Alert and oriented. Well appearing and in no acute distress.  She is very pleasant. Eyes: Conjunctivae are normal. Head: Atraumatic.  Some slight conjunctiva pallor Nose: No congestion/rhinnorhea. Mouth/Throat: Mucous membranes are  moist. Neck: No stridor.  Cardiovascular: Normal rate, regular rhythm. Grossly normal heart sounds.  Good peripheral circulation. Respiratory: Normal respiratory effort.  No retractions. Lungs CTAB. Gastrointestinal: Soft and nontender. No distention. Musculoskeletal: No lower extremity tenderness nor edema. Neurologic:  Normal speech and language. No gross focal neurologic deficits are appreciated.  Skin:  Skin is warm, dry and intact. No rash noted. Psychiatric: Mood and affect are normal. Speech and behavior are normal.  ____________________________________________   LABS (all labs ordered are listed, but only abnormal results are displayed)  Labs Reviewed  BASIC METABOLIC PANEL - Abnormal; Notable for the following components:      Result Value   Glucose, Bld 135 (*)    Calcium 8.6 (*)    All other components within normal limits  CBC - Abnormal; Notable for  the following components:   RBC 3.65 (*)    Hemoglobin 7.0 (*)    HCT 25.4 (*)    MCV 69.6 (*)    MCH 19.2 (*)    MCHC 27.6 (*)    RDW 17.3 (*)    All other components within normal limits  HCG, QUANTITATIVE, PREGNANCY  FIBRIN DERIVATIVES D-DIMER ([facility] ONLY)  TROPONIN I (HIGH SENSITIVITY)  TROPONIN I (HIGH SENSITIVITY)   ____________________________________________  EKG  Reviewed tachycardia at 1635 Heart rate 100 QRS 90 QTc 460 Sinus tachycardia, no evidence of acute ischemia.  Minimally elevated rate ____________________________________________  RADIOLOGY  Dg Chest 2 View  Result Date: 03/11/2019 CLINICAL DATA:  Chest pain. EXAM: CHEST - 2 VIEW COMPARISON:  03/06/2018 FINDINGS: The heart size and mediastinal contours are within normal limits. Both lungs are clear. Chronic coarsened interstitial markings are noted bilaterally which may reflect smoking related changes. The visualized skeletal structures are unremarkable. IMPRESSION: 1. No active cardiopulmonary abnormalities. Electronically Signed   By:  Signa Kellaylor  Stroud M.D.   On: 03/11/2019 14:53     ____________________________________________   PROCEDURES  Procedure(s) performed: None  Procedures  Critical Care performed: No  ____________________________________________   INITIAL IMPRESSION / ASSESSMENT AND PLAN / ED COURSE  Pertinent labs & imaging results that were available during my care of the patient were reviewed by me and considered in my medical decision making (see chart for details).   Patient relates a intermittent left shoulder pain associated with feelings of lightheadedness and anxiety.  Her clinical examination reassuring, but she was tachycardic on arrival, heart rate 105 in the room sitting at rest with me.  She denies any acute symptoms at present, but I suspect she is having some symptoms of symptomatic anemia.  She reports that she gets lightheaded with standing and walking, and she also relates a history of frequent Nedra HaiLee very heavy menstrual cycles.  None a birth control.  Additionally she reports a previous history of coronavirus, but presently does not appear to have any acute symptoms of ongoing infection.  Hemoglobin is 7.0.  Discussed with hematology.  Denies any black or bloody stools, there is no signs or symptoms of acute ongoing hemorrhage or bleeding at this time I suspect based on the history of this being present for about a month's time that she has had somewhat insidious onset of anemia  Clinical Course as of Mar 11 1807  Wed Mar 11, 2019  1623 Discussed case, and Dr. Orlie DakinFinnegan reviewed labs and history.  He would recommend the patient receive 1 unit of blood in the ER, and if feeling better she could be discharged to follow-up closely in his clinic on Monday or Tuesday.  I discussed with the patient she is in agreement with this plan, but does not have assistance with a babysitter for her 4 children, she is currently making calls to see if this can happen.  If she is unable to locate a babysitter, she  would prefer to be discharged and will return if worsening and will make plans to come tomorrow when she knows she can have a babysitter available for children.   [MQ]    Clinical Course User Index [MQ] Sharyn CreamerQuale, Houa Nie, MD    ----------------------------------------- 6:08 PM on 03/11/2019 -----------------------------------------  Patient resting comfortably.  Heart rate 95, she was not able to secure childcare for her children tonight.  She is requesting discharge, she plans to return the emergency room tomorrow for reevaluation and possible need for blood transfusion.  Dr. Grayland Ormond was not able to secure a chair for her in the infusion center tomorrow, but will see her Monday in his clinic for close follow-up.  I discussed with the patient risks of discharge at this time, including potential for worsening, she could pass out, become injured, lose more blood, or suffer other injury.  Patient understanding agreeable with plan, she is comfortable with discharge and appears stable for discharge with very close follow-up  She is not driving home.  Vitals  Vitals:   03/11/19 1730 03/11/19 1818  BP:  (!) 150/96  Pulse:  89  Resp: 19 17  Temp:  98.7 F (37.1 C)  SpO2:  100%    ____________________________________________   FINAL CLINICAL IMPRESSION(S) / ED DIAGNOSES  Final diagnoses:  Microcytic anemia  Atypical chest pain        Note:  This document was prepared using Dragon voice recognition software and may include unintentional dictation errors       Delman Kitten, MD 03/11/19 2125

## 2019-03-11 NOTE — Discharge Instructions (Signed)
Please return to the emergency room right away if your symptoms worsen, you pass out, you feel increased weakness, recurring chest pain, trouble breathing or other new concerns arise.  Please return to the emergency room once or able to use secure assistance with your children so we may further evaluate.  Follow-up with Dr. Grayland Ormond on Monday, his clinic is planning to call you to schedule this visit tomorrow.

## 2019-03-12 ENCOUNTER — Emergency Department
Admission: EM | Admit: 2019-03-12 | Discharge: 2019-03-12 | Disposition: A | Discharge status: Home / Self Care | Payer: Self-pay | Attending: Emergency Medicine | Admitting: Emergency Medicine

## 2019-03-12 ENCOUNTER — Encounter: Payer: Self-pay | Admitting: Emergency Medicine

## 2019-03-12 DIAGNOSIS — R5383 Other fatigue: Secondary | ICD-10-CM | POA: Insufficient documentation

## 2019-03-12 DIAGNOSIS — K649 Unspecified hemorrhoids: Secondary | ICD-10-CM | POA: Insufficient documentation

## 2019-03-12 DIAGNOSIS — F17210 Nicotine dependence, cigarettes, uncomplicated: Secondary | ICD-10-CM | POA: Insufficient documentation

## 2019-03-12 DIAGNOSIS — D500 Iron deficiency anemia secondary to blood loss (chronic): Secondary | ICD-10-CM | POA: Insufficient documentation

## 2019-03-12 DIAGNOSIS — F1210 Cannabis abuse, uncomplicated: Secondary | ICD-10-CM | POA: Insufficient documentation

## 2019-03-12 DIAGNOSIS — N920 Excessive and frequent menstruation with regular cycle: Secondary | ICD-10-CM | POA: Insufficient documentation

## 2019-03-12 DIAGNOSIS — I10 Essential (primary) hypertension: Secondary | ICD-10-CM | POA: Insufficient documentation

## 2019-03-12 LAB — BASIC METABOLIC PANEL
Anion gap: 8 (ref 5–15)
BUN: 12 mg/dL (ref 6–20)
CO2: 24 mmol/L (ref 22–32)
Calcium: 8.9 mg/dL (ref 8.9–10.3)
Chloride: 105 mmol/L (ref 98–111)
Creatinine, Ser: 0.82 mg/dL (ref 0.44–1.00)
GFR calc Af Amer: >60 mL/min (ref >60)
GFR calc non Af Amer: >60 mL/min (ref >60)
Glucose, Bld: 103 mg/dL — ABNORMAL HIGH (ref 70–99)
Potassium: 4.6 mmol/L (ref 3.5–5.1)
Sodium: 137 mmol/L (ref 135–145)

## 2019-03-12 LAB — CBC WITH DIFFERENTIAL/PLATELET
Abs Immature Granulocytes: 0.01 10*3/uL (ref 0.00–0.07)
Basophils Absolute: 0.1 10*3/uL (ref 0.0–0.1)
Basophils Relative: 1 %
Eosinophils Absolute: 0.6 10*3/uL — ABNORMAL HIGH (ref 0.0–0.5)
Eosinophils Relative: 10 %
HCT: 26.5 % — ABNORMAL LOW (ref 36.0–46.0)
Hemoglobin: 7.2 g/dL — ABNORMAL LOW (ref 12.0–15.0)
Immature Granulocytes: 0 %
Lymphocytes Relative: 29 %
Lymphs Abs: 1.6 10*3/uL (ref 0.7–4.0)
MCH: 19.1 pg — ABNORMAL LOW (ref 26.0–34.0)
MCHC: 27.2 g/dL — ABNORMAL LOW (ref 30.0–36.0)
MCV: 70.5 fL — ABNORMAL LOW (ref 80.0–100.0)
Monocytes Absolute: 0.4 10*3/uL (ref 0.1–1.0)
Monocytes Relative: 7 %
Neutro Abs: 2.9 10*3/uL (ref 1.7–7.7)
Neutrophils Relative %: 53 %
Platelets: 230 10*3/uL (ref 150–400)
RBC: 3.76 MIL/uL — ABNORMAL LOW (ref 3.87–5.11)
RDW: 17.4 % — ABNORMAL HIGH (ref 11.5–15.5)
WBC: 5.5 10*3/uL (ref 4.0–10.5)
nRBC: 0 % (ref 0.0–0.2)

## 2019-03-12 LAB — PREPARE RBC (CROSSMATCH)

## 2019-03-12 MED ORDER — FERROUS SULFATE 325 (65 FE) MG PO TABS: 325.0000 mg | ORAL_TABLET | Freq: Every day | ORAL | 1 refills | Status: AC

## 2019-03-12 MED ORDER — DOCUSATE SODIUM 100 MG PO CAPS: 100.0000 mg | ORAL_CAPSULE | Freq: Two times a day (BID) | ORAL | 1 refills | Status: AC

## 2019-03-12 MED ORDER — HYDROCORTISONE ACETATE 25 MG RE SUPP: 25.0000 mg | Freq: Two times a day (BID) | RECTAL | 1 refills | Status: AC

## 2019-03-12 MED ORDER — SODIUM CHLORIDE 0.9 % IV SOLN
Freq: Once | INTRAVENOUS | Status: AC
  Administered 2019-03-12: 15:00:00 via INTRAVENOUS

## 2019-03-12 NOTE — ED Triage Notes (Signed)
Patient says she is anemic and told to come back for transfusion by ed doctor.

## 2019-03-12 NOTE — ED Notes (Signed)
Electronic consent for blood products obtained.  

## 2019-03-12 NOTE — ED Notes (Signed)
tolerating blood transfusion well.

## 2019-03-12 NOTE — ED Notes (Signed)
Patient reports was told by physician in ed to return today for blood transfusion. States did not stay yesterday because she did not have a Public librarian for her child who was in car outside awaiting her to be discharged. C/O of continued chest pain and back pain also feeling tackycardia. Sent to ed from urgy care yesrday to evaluated cp. Labs drawn in ed. Awaiting md eval and further plan of care. Will monitor.

## 2019-03-12 NOTE — ED Notes (Signed)
transfusion completed. Vss. Awaiting further plan of care.

## 2019-03-12 NOTE — ED Notes (Signed)
Transfusion started

## 2019-03-12 NOTE — ED Notes (Signed)
tolerating blood product well, vss. Lunch provided.

## 2019-03-12 NOTE — ED Provider Notes (Signed)
[facility] Emergency Department Provider Note   ____________________________________________   First MD Initiated Contact with Patient 03/12/19 1231     (approximate)  I have reviewed the triage vital signs and the nursing notes.   HISTORY  Chief Complaint Abnormal Lab    HPI Debbie Padilla is a 33 y.o. female with past medical history of bipolar disorder who presents to the ED for abnormal labs.  Patient reports she was seen in the ED yesterday and told that her blood counts were low enough to require a transfusion, however she was unable to secure childcare and decided to sign out AMA.  She returns today for a blood transfusion as instructed.  She states she has been having intermittent pins-and-needles feeling in the center of her chest for the past couple of weeks as well as generalized fatigue for at least the past month.  She had been evaluated at urgent care multiple times for the chest pain with negative work-up, until she was told her blood counts were low yesterday.  She reports that she has been dealing with heavy periods for multiple years, but is never required transfusion in the past.  She has not had any change in her symptoms from yesterday, denies any fevers, chills, cough, or shortness of breath.  She states her last period ended about a week ago.        Past Medical History:  Diagnosis Date  . Anxiety   . Bipolar disorder (HCC)   . Depression   . Hypertension    during pregnancy  . Preterm labor     Patient Active Problem List   Diagnosis Date Noted  . Gestational thrombocytopenia (HCC) 01/01/2016  . Abdominal pain 12/31/2015  . Active labor at term 12/31/2015  . Positive GBS test 12/27/2015  . Bipolar disorder (HCC) 12/07/2015  . H/O premature rupture of membranes in previous pregnancy, currently pregnant 11/10/2015  . H/O preterm delivery, currently pregnant 11/10/2015  . Prenatal care insufficient 11/10/2015  .  Short interval between pregnancies affecting pregnancy in third trimester, antepartum 11/10/2015  . Abnormal drug screen 09/27/2015  . Tobacco abuse 10/06/2014    Past Surgical History:  Procedure Laterality Date  . ANKLE INCISION AND DRAINAGE Left July 2012  . DILATION AND CURETTAGE OF UTERUS    . OPEN REDUCTION INTERNAL FIXATION (ORIF) PROXIMAL PHALANX Right 09/02/2018   Procedure: RIGHT OPEN REDUCTION INTERNAL FIXATION (ORIF) INDEX  PROXIMAL PHALANX;  Surgeon: Kennedy Bucker, MD;  Location: [facility] ORS;  Service: Orthopedics;  Laterality: Right;  . TUBAL LIGATION Bilateral 01/01/2016   Procedure: POST PARTUM TUBAL LIGATION;  Surgeon: [name], MD;  Location: [facility] ORS;  Service: Gynecology;  Laterality: Bilateral;    Prior to Admission medications   Medication Sig Start Date End Date Taking? Authorizing Provider  amoxicillin (AMOXIL) 500 MG capsule Take 1 capsule (500 mg total) by mouth 3 (three) times daily. Patient not taking: Reported on 09/02/2018 07/03/18   Tommi Rumps, PA-C  docusate sodium (COLACE) 100 MG capsule Take 1 capsule (100 mg total) by mouth 2 (two) times daily. 03/12/19 03/11/20  Chesley Noon, MD  ferrous sulfate 325 (65 FE) MG tablet Take 1 tablet (325 mg total) by mouth daily. 03/12/19 05/11/19  Chesley Noon, MD  HYDROcodone-acetaminophen (NORCO) 5-325 MG tablet Take 1 tablet by mouth every 6 (six) hours as needed. Patient not taking: Reported on 03/11/2019 09/02/18   Kennedy Bucker, MD  hydrocortisone (ANUSOL-HC) 25 MG suppository Place 1 suppository (25 mg  total) rectally every 12 (twelve) hours for 12 days. 03/12/19 03/24/19  Chesley NoonJessup, Ceara Wrightson, MD  lidocaine (XYLOCAINE) 2 % solution Apply solution to a cotton ball and applied to tooth ache every 4 hours as needed. Patient not taking: Reported on 09/02/2018 07/03/18   Tommi RumpsSummers, Rhonda L, PA-C    Allergies Patient has no known allergies.  Family History  Problem Relation Age of Onset  . Asthma Daughter   .  Asthma Paternal Grandmother   . Diabetes Paternal Grandmother   . Cancer Neg Hx   . Heart disease Neg Hx     Social History Social History   Tobacco Use  . Smoking status: Current Every Day Smoker    Packs/day: 0.50    Years: 11.00    Pack years: 5.50    Types: Cigarettes  . Smokeless tobacco: Never Used  Substance Use Topics  . Alcohol use: Yes  . Drug use: Yes    Types: Marijuana    Review of Systems  Constitutional: No fever/chills Eyes: No visual changes. ENT: No sore throat. Cardiovascular: Positive for chest pain. Respiratory: Denies shortness of breath. Gastrointestinal: No abdominal pain.  No nausea, no vomiting.  No diarrhea.  No constipation. Genitourinary: Negative for dysuria. Musculoskeletal: Negative for back pain. Skin: Negative for rash. Neurological: Negative for headaches, focal weakness or numbness.  ____________________________________________   PHYSICAL EXAM:  VITAL SIGNS: ED Triage Vitals  Enc Vitals Group     BP 03/12/19 1052 (!) 141/86     Pulse --      Resp 03/12/19 1052 18     Temp 03/12/19 1052 99.7 F (37.6 C)     Temp Source 03/12/19 1052 Oral     SpO2 03/12/19 1052 100 %     Weight 03/12/19 1053 150 lb (68 kg)     Height 03/12/19 1053 5\' 2"  (1.575 m)     Head Circumference --      Peak Flow --      Pain Score 03/12/19 1053 5     Pain Loc --      Pain Edu? --      Excl. in GC? --     Constitutional: Alert and oriented. Eyes: Conjunctivae are normal. Head: Atraumatic. Nose: No congestion/rhinnorhea. Mouth/Throat: Mucous membranes are moist. Neck: Normal ROM Cardiovascular: Normal rate, regular rhythm. Grossly normal heart sounds. Respiratory: Normal respiratory effort.  No retractions. Lungs CTAB. Gastrointestinal: Soft and nontender. No distention. Genitourinary: deferred Musculoskeletal: No lower extremity tenderness nor edema. Neurologic:  Normal speech and language. No gross focal neurologic deficits are  appreciated. Skin:  Skin is warm, dry and intact. No rash noted. Psychiatric: Mood and affect are normal. Speech and behavior are normal.  ____________________________________________   LABS (all labs ordered are listed, but only abnormal results are displayed)  Labs Reviewed  BASIC METABOLIC PANEL - Abnormal; Notable for the following components:      Result Value   Glucose, Bld 103 (*)    All other components within normal limits  CBC WITH DIFFERENTIAL/PLATELET - Abnormal; Notable for the following components:   RBC 3.76 (*)    Hemoglobin 7.2 (*)    HCT 26.5 (*)    MCV 70.5 (*)    MCH 19.1 (*)    MCHC 27.2 (*)    RDW 17.4 (*)    Eosinophils Absolute 0.6 (*)    All other components within normal limits  TYPE AND SCREEN  PREPARE RBC (CROSSMATCH)     PROCEDURES  Procedure(s) performed (including Critical  Care):  Procedures   ____________________________________________   INITIAL IMPRESSION / ASSESSMENT AND PLAN / ED COURSE       33 year old female presenting to the ED for reevaluation after being told she had a low hemoglobin yesterday and signed out AMA prior to transfusion.  Reviewed work-up of her chest pain from yesterday which included a negative EKG and 2 sets troponin.  Chest x-ray from yesterday also negative for acute process, and work-up was only remarkable for low H&H.  Will recheck CBC and BMP today, but otherwise no indication for repeating other testing.  We will plan for transfusion of 1 unit PRBCs and follow-up with heme-onc that patient already has scheduled in 4 days.  Also counseled patient to follow-up with OB/GYN for evaluation of her heavy periods and will plan to start iron and vitamin C supplementation.  Patient pleated transfusion without issue, will discharge home with heme-onc as well as OB/GYN follow-up.  Will start patient on iron.  She is also complaining of hemorrhoids, will prescribe Anusol and Colace.  Counseled to return to the ED for new  worsening symptoms.  Patient agrees with plan.      ____________________________________________   FINAL CLINICAL IMPRESSION(S) / ED DIAGNOSES  Final diagnoses:  Iron deficiency anemia due to chronic blood loss  Menorrhagia with regular cycle  Hemorrhoids, unspecified hemorrhoid type     ED Discharge Orders         Ordered    ferrous sulfate 325 (65 FE) MG tablet  Daily     03/12/19 1648    hydrocortisone (ANUSOL-HC) 25 MG suppository  Every 12 hours     03/12/19 1648    docusate sodium (COLACE) 100 MG capsule  2 times daily     03/12/19 1648           Note:  This document was prepared using Dragon voice recognition software and may include unintentional dictation errors.   Blake Divine, MD 03/12/19 [phone]

## 2019-03-12 NOTE — ED Triage Notes (Signed)
Patient states she was evaluated yesterday due to elevated heart rate and not feeling well and was found to have a hemoglobin lower than 8.  MD recommended a blood transfusion but patient stated she did not have a babysitter and agreed to come back this am for a transfusion.  Patient also states she just noticed this am that she has a hemorrhoid.

## 2019-03-13 ENCOUNTER — Other Ambulatory Visit: Payer: Self-pay

## 2019-03-13 ENCOUNTER — Encounter: Payer: Self-pay | Admitting: Oncology

## 2019-03-13 LAB — BPAM RBC
Blood Product Expiration Date: 202010232359
ISSUE DATE / TIME: 202010221420
Unit Type and Rh: 9500

## 2019-03-13 LAB — TYPE AND SCREEN
ABO/RH(D): O POS
Antibody Screen: NEGATIVE
Unit division: 0

## 2019-03-13 NOTE — Progress Notes (Signed)
Patient pre screened for office appointment, no questions or concerns today. 

## 2019-03-15 NOTE — Progress Notes (Signed)
  [place]  Telephone:([phone] [phone]  ID: Debbie Padilla OB: December 16, 1985  MR#: [id number]  ZYY#:[id number]  Patient Care Team: Patient, No Pcp Per as PCP - General (General Practice)   Lloyd Huger, MD   03/15/2019 7:03 AM     This encounter was created in error - please disregard.

## 2019-03-16 ENCOUNTER — Inpatient Hospital Stay: Payer: Self-pay | Admitting: Oncology

## 2019-03-16 ENCOUNTER — Other Ambulatory Visit: Payer: Self-pay | Admitting: Oncology

## 2019-03-16 DIAGNOSIS — D509 Iron deficiency anemia, unspecified: Secondary | ICD-10-CM | POA: Insufficient documentation

## 2019-03-17 ENCOUNTER — Inpatient Hospital Stay: Payer: Self-pay

## 2019-03-17 ENCOUNTER — Inpatient Hospital Stay: Payer: Self-pay | Attending: Oncology | Admitting: Oncology

## 2019-03-17 ENCOUNTER — Other Ambulatory Visit: Payer: Self-pay

## 2019-03-17 ENCOUNTER — Encounter: Payer: Self-pay | Admitting: Oncology

## 2019-03-17 VITALS — BP 125/75 | HR 102 | Temp 98.4°F | Resp 18 | Wt 155.5 lb

## 2019-03-17 VITALS — BP 117/73 | HR 92 | Temp 97.7°F | Resp 18

## 2019-03-17 DIAGNOSIS — D509 Iron deficiency anemia, unspecified: Secondary | ICD-10-CM | POA: Insufficient documentation

## 2019-03-17 DIAGNOSIS — Z79899 Other long term (current) drug therapy: Secondary | ICD-10-CM | POA: Insufficient documentation

## 2019-03-17 DIAGNOSIS — I10 Essential (primary) hypertension: Secondary | ICD-10-CM | POA: Insufficient documentation

## 2019-03-17 DIAGNOSIS — F17210 Nicotine dependence, cigarettes, uncomplicated: Secondary | ICD-10-CM | POA: Insufficient documentation

## 2019-03-17 LAB — CBC WITH DIFFERENTIAL/PLATELET
Abs Immature Granulocytes: 0.02 10*3/uL (ref 0.00–0.07)
Basophils Absolute: 0.1 10*3/uL (ref 0.0–0.1)
Basophils Relative: 1 %
Eosinophils Absolute: 1.4 10*3/uL — ABNORMAL HIGH (ref 0.0–0.5)
Eosinophils Relative: 14 %
HCT: 29.3 % — ABNORMAL LOW (ref 36.0–46.0)
Hemoglobin: 8.4 g/dL — ABNORMAL LOW (ref 12.0–15.0)
Immature Granulocytes: 0 %
Lymphocytes Relative: 24 %
Lymphs Abs: 2.3 10*3/uL (ref 0.7–4.0)
MCH: 20.9 pg — ABNORMAL LOW (ref 26.0–34.0)
MCHC: 28.7 g/dL — ABNORMAL LOW (ref 30.0–36.0)
MCV: 72.9 fL — ABNORMAL LOW (ref 80.0–100.0)
Monocytes Absolute: 0.7 10*3/uL (ref 0.1–1.0)
Monocytes Relative: 8 %
Neutro Abs: 5.2 10*3/uL (ref 1.7–7.7)
Neutrophils Relative %: 53 %
Platelets: 205 10*3/uL (ref 150–400)
RBC: 4.02 MIL/uL (ref 3.87–5.11)
RDW: 20.6 % — ABNORMAL HIGH (ref 11.5–15.5)
WBC: 9.7 10*3/uL (ref 4.0–10.5)
nRBC: 0 % (ref 0.0–0.2)

## 2019-03-17 LAB — IRON AND TIBC
Iron: 16 ug/dL — ABNORMAL LOW (ref 28–170)
Saturation Ratios: 3 % — ABNORMAL LOW (ref 10.4–31.8)
TIBC: 495 ug/dL — ABNORMAL HIGH (ref 250–450)
UIBC: 479 ug/dL

## 2019-03-17 LAB — FERRITIN: Ferritin: 9 ng/mL — ABNORMAL LOW (ref 11–307)

## 2019-03-17 MED ORDER — SODIUM CHLORIDE 0.9 % IV SOLN
510.0000 mg | Freq: Once | INTRAVENOUS | Status: AC
  Administered 2019-03-17: 12:00:00 510 mg via INTRAVENOUS
  Filled 2019-03-17: qty 17

## 2019-03-17 MED ORDER — SODIUM CHLORIDE 0.9 % IV SOLN
Freq: Once | INTRAVENOUS | Status: AC
  Administered 2019-03-17: 12:00:00 via INTRAVENOUS
  Filled 2019-03-17: qty 250

## 2019-03-17 NOTE — Progress Notes (Signed)
[facility]  Telephone:([phone]) [phone] [phone]  ID: Debbie Padilla OB: 02/21/1986  MR#: [id number]  AOZ#:[id number]#:[id number]  Patient Care Team: Patient, No Pcp Per as PCP - General (General Practice)  CHIEF COMPLAINT: Iron deficiency anemia.  INTERVAL HISTORY: Patient is a 33 year old female who was recently evaluated in the emergency room for increasing weakness and fatigue.  She was found to have a profoundly decreased hemoglobin requiring 1 unit of packed red blood cells.  Patient feels slightly improved today, but not back to her baseline.  She has no neurologic complaints.  She denies any recent fevers or illnesses.  She has a good appetite and denies weight loss.  She has periodic chest pain, but denies cough or hemoptysis.  She does admit to occasional dyspnea on exertion and shortness of breath.  She denies any nausea, vomiting, constipation, or diarrhea.  She has no melena or hematochezia.  She has no urinary complaints.  Patient offers no further specific complaints today.  REVIEW OF SYSTEMS:   Review of Systems  Constitutional: Positive for malaise/fatigue. Negative for fever and weight loss.  Respiratory: Positive for shortness of breath. Negative for cough and hemoptysis.   Cardiovascular: Positive for chest pain. Negative for leg swelling.  Gastrointestinal: Negative.  Negative for abdominal pain and blood in stool.  Genitourinary: Negative.  Negative for hematuria.  Musculoskeletal: Negative.  Negative for back pain.  Skin: Negative.  Negative for rash.  Neurological: Positive for weakness. Negative for dizziness and headaches.  Psychiatric/Behavioral: Negative.  The patient is not nervous/anxious.     As per HPI. Otherwise, a complete review of systems is negative.  PAST MEDICAL HISTORY: Past Medical History:  Diagnosis Date  . Anxiety   . Bipolar disorder (HCC)   . Depression   . Hypertension    during pregnancy  . Preterm labor      PAST SURGICAL HISTORY: Past Surgical History:  Procedure Laterality Date  . ANKLE INCISION AND DRAINAGE Left July 2012  . DILATION AND CURETTAGE OF UTERUS    . OPEN REDUCTION INTERNAL FIXATION (ORIF) PROXIMAL PHALANX Right 09/02/2018   Procedure: RIGHT OPEN REDUCTION INTERNAL FIXATION (ORIF) INDEX  PROXIMAL PHALANX;  Surgeon: Kennedy BuckerMenz, Michael, MD;  Location: [facility] ORS;  Service: Orthopedics;  Laterality: Right;  . TUBAL LIGATION Bilateral 01/01/2016   Procedure: POST PARTUM TUBAL LIGATION;  Surgeon: Hildred LaserAnika Cherry, MD;  Location: [facility] ORS;  Service: Gynecology;  Laterality: Bilateral;    FAMILY HISTORY: Family History  Problem Relation Age of Onset  . Asthma Daughter   . Asthma Paternal Grandmother   . Diabetes Paternal Grandmother   . Crohn's disease Mother   . Irritable bowel syndrome Mother   . Heart disease Father   . Cancer Neg Hx     ADVANCED DIRECTIVES (Y/N):  N  HEALTH MAINTENANCE: Social History   Tobacco Use  . Smoking status: Current Every Day Smoker    Packs/day: 0.50    Years: 11.00    Pack years: 5.50    Types: Cigarettes  . Smokeless tobacco: Never Used  Substance Use Topics  . Alcohol use: Yes  . Drug use: Yes    Types: Marijuana     Colonoscopy:  PAP:  Bone density:  Lipid panel:  No Known Allergies  Current Outpatient Medications  Medication Sig Dispense Refill  . docusate sodium (COLACE) 100 MG capsule Take 1 capsule (100 mg total) by mouth 2 (two) times daily. 60 capsule 1  . ferrous sulfate 325 (65 FE) MG  tablet Take 1 tablet (325 mg total) by mouth daily. 30 tablet 1  . hydrocortisone (ANUSOL-HC) 25 MG suppository Place 1 suppository (25 mg total) rectally every 12 (twelve) hours for 12 days. 12 suppository 1   No current facility-administered medications for this visit.     OBJECTIVE: Vitals:   03/17/19 1003  BP: 125/75  Pulse: (!) 102  Resp: 18  Temp: 98.4 F (36.9 C)  SpO2: 100%     Body mass index is 28.44 kg/m.    ECOG FS:1 -  Symptomatic but completely ambulatory  General: Well-developed, well-nourished, no acute distress. Eyes: Pink conjunctiva, anicteric sclera. HEENT: Normocephalic, moist mucous membranes, clear oropharnyx. Lungs: Clear to auscultation bilaterally. Heart: Regular rate and rhythm. No rubs, murmurs, or gallops. Abdomen: Soft, nontender, nondistended. No organomegaly noted, normoactive bowel sounds. Musculoskeletal: No edema, cyanosis, or clubbing. Neuro: Alert, answering all questions appropriately. Cranial nerves grossly intact. Skin: No rashes or petechiae noted. Psych: Normal affect. Lymphatics: No cervical, calvicular, axillary or inguinal LAD.   LAB RESULTS:  Lab Results  Component Value Date   NA 137 03/12/2019   K 4.6 03/12/2019   CL 105 03/12/2019   CO2 24 03/12/2019   GLUCOSE 103 (H) 03/12/2019   BUN 12 03/12/2019   CREATININE 0.82 03/12/2019   CALCIUM 8.9 03/12/2019   PROT 7.6 08/19/2017   ALBUMIN 4.2 08/19/2017   AST 33 08/19/2017   ALT 25 08/19/2017   ALKPHOS 64 08/19/2017   BILITOT 0.6 08/19/2017   GFRNONAA >60 03/12/2019   GFRAA >60 03/12/2019    Lab Results  Component Value Date   WBC 9.7 03/17/2019   NEUTROABS 5.2 03/17/2019   HGB 8.4 (L) 03/17/2019   HCT 29.3 (L) 03/17/2019   MCV 72.9 (L) 03/17/2019   PLT 205 03/17/2019   Lab Results  Component Value Date   IRON 16 (L) 03/17/2019   TIBC 495 (H) 03/17/2019   IRONPCTSAT 3 (L) 03/17/2019   Lab Results  Component Value Date   FERRITIN 9 (L) 03/17/2019     STUDIES: Dg Chest 2 View  Result Date: 03/11/2019 CLINICAL DATA:  Chest pain. EXAM: CHEST - 2 VIEW COMPARISON:  03/06/2018 FINDINGS: The heart size and mediastinal contours are within normal limits. Both lungs are clear. Chronic coarsened interstitial markings are noted bilaterally which may reflect smoking related changes. The visualized skeletal structures are unremarkable. IMPRESSION: 1. No active cardiopulmonary abnormalities. Electronically  Signed   By: Signa Kell M.D.   On: 03/11/2019 14:53    ASSESSMENT: Iron deficiency anemia.  PLAN:    1.  Iron deficiency anemia: Likely chronic and secondary to poor oral iron intake combined with heavy menses.  Patient's hemoglobin is improved since the emergency room, but still significantly decreased.  She also has decreased iron stores.  Proceed with 510 mg IV Feraheme today.  Return to clinic in 1 and 2 weeks for additional infusions.  Patient will then return to clinic in 8 weeks with repeat laboratory work, and further evaluation, and continuation of treatment if necessary.  If patient's hemoglobin and iron stores do not improve with IV iron, will initiate full anemia work-up at that time.  I spent a total of 45 minutes face-to-face with the patient of which greater than 50% of the visit was spent in counseling and coordination of care as detailed above.   Patient expressed understanding and was in agreement with this plan. She also understands that She can call clinic at any time with any questions, concerns,  or complaints.    Lloyd Huger, MD   03/17/2019 3:37 PM

## 2019-03-17 NOTE — Progress Notes (Signed)
New patient visit for iron deficiency anemia.  Pt states "tired and having pain in left breast at times".  Pt also reports has abcess on left side of roof of mouth.

## 2019-03-19 ENCOUNTER — Encounter: Payer: Self-pay | Admitting: Oncology

## 2019-03-20 ENCOUNTER — Telehealth: Payer: Self-pay

## 2019-03-20 ENCOUNTER — Encounter: Payer: Self-pay | Admitting: Pharmacy Technician

## 2019-03-20 NOTE — Progress Notes (Signed)
Patient has been approved for drug assistance by [employer] for Feraheme. The enrollment period is from 03/17/19-03/15/20 based on self pay. First DOS covered is 03/17/19.

## 2019-03-20 NOTE — Telephone Encounter (Signed)
Head and neck pain unrelated. Abx ok from our standpoint, although would discuss with OB-Gyn

## 2019-03-20 NOTE — Telephone Encounter (Signed)
Patient called and would like to ask if her shoulder and neck pain is to be expected from her treatment,mentions she has been using a heat pad for the pain. Also patient has an abscess in her mouth she went to her dentist and they want to giver her an antibiotic to help but also do a tooth extraction they want to know if this is ok to do the day after her infusion.

## 2019-03-20 NOTE — Telephone Encounter (Signed)
Tried calling patient unable to leave message will try later

## 2019-03-24 ENCOUNTER — Other Ambulatory Visit: Payer: Self-pay

## 2019-03-24 ENCOUNTER — Inpatient Hospital Stay: Payer: Self-pay | Attending: Oncology

## 2019-03-24 VITALS — BP 125/88 | HR 77 | Temp 98.6°F | Resp 16

## 2019-03-24 DIAGNOSIS — I10 Essential (primary) hypertension: Secondary | ICD-10-CM | POA: Insufficient documentation

## 2019-03-24 DIAGNOSIS — D509 Iron deficiency anemia, unspecified: Secondary | ICD-10-CM | POA: Insufficient documentation

## 2019-03-24 DIAGNOSIS — F17210 Nicotine dependence, cigarettes, uncomplicated: Secondary | ICD-10-CM | POA: Insufficient documentation

## 2019-03-24 DIAGNOSIS — Z79899 Other long term (current) drug therapy: Secondary | ICD-10-CM | POA: Insufficient documentation

## 2019-03-24 MED ORDER — SODIUM CHLORIDE 0.9 % IV SOLN
Freq: Once | INTRAVENOUS | Status: AC
  Administered 2019-03-24: 12:00:00 via INTRAVENOUS
  Filled 2019-03-24: qty 250

## 2019-03-24 MED ORDER — SODIUM CHLORIDE 0.9 % IV SOLN
510.0000 mg | Freq: Once | INTRAVENOUS | Status: AC
  Administered 2019-03-24: 510 mg via INTRAVENOUS
  Filled 2019-03-24: qty 17

## 2019-03-25 ENCOUNTER — Telehealth: Payer: Self-pay | Admitting: *Deleted

## 2019-03-25 NOTE — Telephone Encounter (Signed)
Called patient twice no answer unable to leave message. I spoke with patient yesterday told her I would bring the letter to her. She left before I could give it to her.Debbie Padilla in infusion said she would come by to get the letter. Letter is downstairs in drawer behind [place].

## 2019-03-25 NOTE — Telephone Encounter (Signed)
Patient called and states that she has a 330 appointment to have her teeth extracted today for abscesses that was seen at her recent appointment. Her dentist wants to be sure she is alright for these extractions with her anemia problem. Please return call to patient ASAP [phone]

## 2019-03-28 ENCOUNTER — Emergency Department: Payer: Self-pay

## 2019-03-28 ENCOUNTER — Other Ambulatory Visit: Payer: Self-pay

## 2019-03-28 ENCOUNTER — Emergency Department
Admission: EM | Admit: 2019-03-28 | Discharge: 2019-03-28 | Disposition: A | Discharge status: Home / Self Care | Payer: Self-pay | Attending: Emergency Medicine | Admitting: Emergency Medicine

## 2019-03-28 DIAGNOSIS — X58XXXA Exposure to other specified factors, initial encounter: Secondary | ICD-10-CM | POA: Insufficient documentation

## 2019-03-28 DIAGNOSIS — F17210 Nicotine dependence, cigarettes, uncomplicated: Secondary | ICD-10-CM | POA: Insufficient documentation

## 2019-03-28 DIAGNOSIS — Y9289 Other specified places as the place of occurrence of the external cause: Secondary | ICD-10-CM | POA: Insufficient documentation

## 2019-03-28 DIAGNOSIS — T189XXA Foreign body of alimentary tract, part unspecified, initial encounter: Secondary | ICD-10-CM | POA: Insufficient documentation

## 2019-03-28 DIAGNOSIS — Z79899 Other long term (current) drug therapy: Secondary | ICD-10-CM | POA: Insufficient documentation

## 2019-03-28 DIAGNOSIS — Y9384 Activity, sleeping: Secondary | ICD-10-CM | POA: Insufficient documentation

## 2019-03-28 DIAGNOSIS — Y998 Other external cause status: Secondary | ICD-10-CM | POA: Insufficient documentation

## 2019-03-28 HISTORY — DX: Anemia, unspecified: D64.9

## 2019-03-28 NOTE — ED Triage Notes (Signed)
Pt to the er due to swallowing a piece of guaze that she had in her mouth after a dental extraction today. Pt in no acute distress. Pt states it felt like it was stuck initially but now it feels "like it's going on down."

## 2019-03-28 NOTE — Discharge Instructions (Addendum)
Your workup was re-assuring. You are tolerating eating. You should examine your stools to make sure it has passed.  If it has not in 2 weeks you call follow up with GI or PCP.    Return for vomiting or any other concerns.

## 2019-03-28 NOTE — ED Provider Notes (Signed)
[facility] Emergency Department Provider Note  ____________________________________________   First MD Initiated Contact with Patient 03/28/19 [phone]     (approximate)  I have reviewed the triage vital signs and the nursing notes.   HISTORY  Chief Complaint Foreign Body    HPI Debbie Padilla is a 33 y.o. female with anxiety who presents with foreign body.  Patient had a dental extraction today and had a piece of gauze in her mouth.  She said that when she was waking up she swallowed the piece of gauze.  Initially she felt like it was stuck in her throat but has since felt like that sensation has gone away.  The sensation was mild, constant, nothing made it better, nothing made it worse.  She has been able to tolerate p.o. since the incident.        Past Medical History:  Diagnosis Date  . Anemia   . Anxiety   . Bipolar disorder (HCC)   . Depression   . Hypertension    during pregnancy  . Preterm labor     Patient Active Problem List   Diagnosis Date Noted  . Iron deficiency anemia 03/16/2019  . Gestational thrombocytopenia (HCC) 01/01/2016  . Abdominal pain 12/31/2015  . Active labor at term 12/31/2015  . Positive GBS test 12/27/2015  . Bipolar disorder (HCC) 12/07/2015  . H/O premature rupture of membranes in previous pregnancy, currently pregnant 11/10/2015  . H/O preterm delivery, currently pregnant 11/10/2015  . Prenatal care insufficient 11/10/2015  . Short interval between pregnancies affecting pregnancy in third trimester, antepartum 11/10/2015  . Abnormal drug screen 09/27/2015  . Tobacco abuse 10/06/2014    Past Surgical History:  Procedure Laterality Date  . ANKLE INCISION AND DRAINAGE Left July 2012  . DILATION AND CURETTAGE OF UTERUS    . OPEN REDUCTION INTERNAL FIXATION (ORIF) PROXIMAL PHALANX Right 09/02/2018   Procedure: RIGHT OPEN REDUCTION INTERNAL FIXATION (ORIF) INDEX  PROXIMAL PHALANX;  Surgeon: Kennedy BuckerMenz,  Michael, MD;  Location: [facility] ORS;  Service: Orthopedics;  Laterality: Right;  . TUBAL LIGATION Bilateral 01/01/2016   Procedure: POST PARTUM TUBAL LIGATION;  Surgeon: Hildred LaserAnika Cherry, MD;  Location: [facility] ORS;  Service: Gynecology;  Laterality: Bilateral;    Prior to Admission medications   Medication Sig Start Date End Date Taking? Authorizing Provider  docusate sodium (COLACE) 100 MG capsule Take 1 capsule (100 mg total) by mouth 2 (two) times daily. 03/12/19 03/11/20  Chesley NoonJessup, Charles, MD  ferrous sulfate 325 (65 FE) MG tablet Take 1 tablet (325 mg total) by mouth daily. 03/12/19 05/11/19  Chesley NoonJessup, Charles, MD    Allergies Patient has no known allergies.  Family History  Problem Relation Age of Onset  . Asthma Daughter   . Asthma Paternal Grandmother   . Diabetes Paternal Grandmother   . Crohn's disease Mother   . Irritable bowel syndrome Mother   . Heart disease Father   . Cancer Neg Hx     Social History Social History   Tobacco Use  . Smoking status: Current Every Day Smoker    Packs/day: 0.50    Years: 11.00    Pack years: 5.50    Types: Cigarettes  . Smokeless tobacco: Never Used  Substance Use Topics  . Alcohol use: Never    Frequency: Never  . Drug use: Not Currently    Types: Marijuana      Review of Systems Constitutional: No fever/chills Eyes: No visual changes. ENT: No sore throat.  Swallowed foreign  body Cardiovascular: Denies chest pain. Respiratory: Denies shortness of breath. Gastrointestinal: No abdominal pain.  No nausea, no vomiting.  No diarrhea.  No constipation. Genitourinary: Negative for dysuria. Musculoskeletal: Negative for back pain. Skin: Negative for rash. Neurological: Negative for headaches, focal weakness or numbness. All other ROS negative ____________________________________________   PHYSICAL EXAM:  VITAL SIGNS: ED Triage Vitals  Enc Vitals Group     BP 03/28/19 0327 129/77     Pulse Rate 03/28/19 0327 89     Resp  03/28/19 0327 19     Temp 03/28/19 0327 98.2 F (36.8 C)     Temp Source 03/28/19 0327 Oral     SpO2 03/28/19 0327 100 %     Weight 03/28/19 0327 151 lb (68.5 kg)     Height 03/28/19 0327 5\' 2"  (1.575 m)     Head Circumference --      Peak Flow --      Pain Score 03/28/19 0339 0     Pain Loc --      Pain Edu? --      Excl. in GC? --     Constitutional: Alert and oriented. Well appearing and in no acute distress. Eyes: Conjunctivae are normal. EOMI. Head: Atraumatic. Nose: No congestion/rhinnorhea. Mouth/Throat: Mucous membranes are moist.  OP is clear Neck: No stridor. Trachea Midline. FROM Cardiovascular: Normal rate, regular rhythm. Grossly normal heart sounds.  Good peripheral circulation. Respiratory: Normal respiratory effort.  No retractions. Lungs CTAB. Gastrointestinal: Soft and nontender. No distention. No abdominal bruits.  Musculoskeletal: No lower extremity tenderness nor edema.  No joint effusions. Neurologic:  Normal speech and language. No gross focal neurologic deficits are appreciated.  Skin:  Skin is warm, dry and intact. No rash noted. Psychiatric: Mood and affect are normal. Speech and behavior are normal. GU: Deferred   ____________________________________________    RADIOLOGY 13/07/20, personally viewed and evaluated these images (plain radiographs) as part of my medical decision making, as well as reviewing the written report by the radiologist.  ED MD interpretation: No pneumonia  Official radiology report(s): Dg Chest 2 View  Result Date: 03/28/2019 CLINICAL DATA:  Initial evaluation for foreign body ingestion. EXAM: CHEST - 2 VIEW COMPARISON:  Prior radiograph from 03/11/2019. FINDINGS: Cardiac and mediastinal silhouettes are within normal limits. No visible radiopaque foreign body seen along the visualized aero digestive tract. Lungs normally inflated. No focal infiltrate, pulmonary edema, or pleural effusion. No pneumothorax. No acute  osseous finding. Probable right renal nephrolithiasis noted. IMPRESSION: 1. No visible radiopaque foreign body identified. 2. No other active cardiopulmonary disease. Electronically Signed   By: 03/13/2019 M.D.   On: 03/28/2019 04:28    ____________________________________________   PROCEDURES  Procedure(s) performed (including Critical Care):  Procedures   ____________________________________________   INITIAL IMPRESSION / ASSESSMENT AND PLAN / ED COURSE  Debbie Padilla was evaluated in Emergency Department on 03/28/2019 for the symptoms described in the history of present illness. She was evaluated in the context of the global COVID-19 pandemic, which necessitated consideration that the patient might be at risk for infection with the SARS-CoV-2 virus that causes COVID-19. Institutional protocols and algorithms that pertain to the evaluation of patients at risk for COVID-19 are in a state of rapid change based on information released by regulatory bodies including the CDC and federal and state organizations. These policies and algorithms were followed during the patient's care in the ED.    Patient presents with swallowing gauze.  There is not  a sharp object or object that would need to be removed emergently.  It is not impacted her in esophagus given she is able to tolerate p.o.  I watched her drink a glass of water.  Also have low suspicion is that her trachea given she is has no respiratory distress, lungs are clear, normal oxygen rate.  I suspect that it is most likely in her stomach.  We discussed that it should pass.  We also discussed follow-up with GI but she has not seen the past in the next 2 weeks.  Patient understands that if she develops vomiting that she should return to the ER.   X-ray without evidence of foreign body.      ____________________________________________   FINAL CLINICAL IMPRESSION(S) / ED DIAGNOSES   Final diagnoses:  Swallowed  foreign body, initial encounter      MEDICATIONS GIVEN DURING THIS VISIT:  Medications - No data to display   ED Discharge Orders    None       Note:  This document was prepared using Dragon voice recognition software and may include unintentional dictation errors.   Vanessa [place], MD 03/28/19 [phone]

## 2019-03-28 NOTE — ED Notes (Signed)
Pt placed in triage 2 for MD to see. Registration called to register. Pt given a cup of water to PO challenge.

## 2019-03-31 ENCOUNTER — Inpatient Hospital Stay: Payer: Self-pay

## 2019-03-31 ENCOUNTER — Other Ambulatory Visit: Payer: Self-pay

## 2019-03-31 VITALS — BP 110/69 | HR 72 | Resp 18

## 2019-03-31 DIAGNOSIS — D509 Iron deficiency anemia, unspecified: Secondary | ICD-10-CM

## 2019-03-31 MED ORDER — SODIUM CHLORIDE 0.9 % IV SOLN
INTRAVENOUS | Status: DC
  Administered 2019-03-31: 12:00:00 via INTRAVENOUS
  Filled 2019-03-31: qty 250

## 2019-03-31 MED ORDER — SODIUM CHLORIDE 0.9 % IV SOLN
510.0000 mg | Freq: Once | INTRAVENOUS | Status: AC
  Administered 2019-03-31: 510 mg via INTRAVENOUS
  Filled 2019-03-31: qty 17

## 2019-04-10 ENCOUNTER — Other Ambulatory Visit: Payer: Self-pay

## 2019-05-07 NOTE — Progress Notes (Deleted)
[name]  Telephone:([phone] [phone]  ID: Debbie Padilla Debbie Padilla  MR#: [id number]  PJK#:[id number]  Patient Care Team: Patient, No Pcp Per as PCP - General (General Practice)  CHIEF COMPLAINT: Iron deficiency anemia.  INTERVAL HISTORY: Patient is a 33 year old female who was recently evaluated in the emergency room for increasing weakness and fatigue.  She was found to have a profoundly decreased hemoglobin requiring 1 unit of packed red blood cells.  Patient feels slightly improved today, but not back to her baseline.  She has no neurologic complaints.  She denies any recent fevers or illnesses.  She has a good appetite and denies weight loss.  She has periodic chest pain, but denies cough or hemoptysis.  She does admit to occasional dyspnea on exertion and shortness of breath.  She denies any nausea, vomiting, constipation, or diarrhea.  She has no melena or hematochezia.  She has no urinary complaints.  Patient offers no further specific complaints today.  REVIEW OF SYSTEMS:   Review of Systems  Constitutional: Positive for malaise/fatigue. Negative for fever and weight loss.  Respiratory: Positive for shortness of breath. Negative for cough and hemoptysis.   Cardiovascular: Positive for chest pain. Negative for leg swelling.  Gastrointestinal: Negative.  Negative for abdominal pain and blood in stool.  Genitourinary: Negative.  Negative for hematuria.  Musculoskeletal: Negative.  Negative for back pain.  Skin: Negative.  Negative for rash.  Neurological: Positive for weakness. Negative for dizziness and headaches.  Psychiatric/Behavioral: Negative.  The patient is not nervous/anxious.     As per HPI. Otherwise, a complete review of systems is negative.  PAST MEDICAL HISTORY: Past Medical History:  Diagnosis Date  . Anemia   . Anxiety   . Bipolar disorder ([place])   . Depression   . Hypertension    during pregnancy  . Preterm  labor     PAST SURGICAL HISTORY: Past Surgical History:  Procedure Laterality Date  . ANKLE INCISION AND DRAINAGE Left July 2012  . DILATION AND CURETTAGE OF UTERUS    . OPEN REDUCTION INTERNAL FIXATION (ORIF) PROXIMAL PHALANX Right 09/02/2018   Procedure: RIGHT OPEN REDUCTION INTERNAL FIXATION (ORIF) INDEX  PROXIMAL PHALANX;  Surgeon: Hessie Knows, MD;  Location: [facility] ORS;  Service: Orthopedics;  Laterality: Right;  . TUBAL LIGATION Bilateral 01/01/2016   Procedure: POST PARTUM TUBAL LIGATION;  Surgeon: Rubie Maid, MD;  Location: [facility] ORS;  Service: Gynecology;  Laterality: Bilateral;    FAMILY HISTORY: Family History  Problem Relation Age of Onset  . Asthma Daughter   . Asthma Paternal Grandmother   . Diabetes Paternal Grandmother   . Crohn's disease Mother   . Irritable bowel syndrome Mother   . Heart disease Father   . Cancer Neg Hx     ADVANCED DIRECTIVES (Y/N):  N  HEALTH MAINTENANCE: Social History   Tobacco Use  . Smoking status: Current Every Day Smoker    Packs/day: 0.50    Years: 11.00    Pack years: 5.50    Types: Cigarettes  . Smokeless tobacco: Never Used  Substance Use Topics  . Alcohol use: Never  . Drug use: Not Currently    Types: Marijuana     Colonoscopy:  PAP:  Bone density:  Lipid panel:  No Known Allergies  Current Outpatient Medications  Medication Sig Dispense Refill  . docusate sodium (COLACE) 100 MG capsule Take 1 capsule (100 mg total) by mouth 2 (two) times daily. 60 capsule 1  . ferrous  sulfate 325 (65 FE) MG tablet Take 1 tablet (325 mg total) by mouth daily. 30 tablet 1   No current facility-administered medications for this visit.    OBJECTIVE: There were no vitals filed for this visit.   There is no height or weight on file to calculate BMI.    ECOG FS:1 - Symptomatic but completely ambulatory  General: Well-developed, well-nourished, no acute distress. Eyes: Pink conjunctiva, anicteric sclera. HEENT: Normocephalic,  moist mucous membranes, clear oropharnyx. Lungs: Clear to auscultation bilaterally. Heart: Regular rate and rhythm. No rubs, murmurs, or gallops. Abdomen: Soft, nontender, nondistended. No organomegaly noted, normoactive bowel sounds. Musculoskeletal: No edema, cyanosis, or clubbing. Neuro: Alert, answering all questions appropriately. Cranial nerves grossly intact. Skin: No rashes or petechiae noted. Psych: Normal affect. Lymphatics: No cervical, calvicular, axillary or inguinal LAD.   LAB RESULTS:  Lab Results  Component Value Date   NA 137 03/12/2019   K 4.6 03/12/2019   CL 105 03/12/2019   CO2 24 03/12/2019   GLUCOSE 103 (H) 03/12/2019   BUN 12 03/12/2019   CREATININE 0.82 03/12/2019   CALCIUM 8.9 03/12/2019   PROT 7.6 08/19/2017   ALBUMIN 4.2 08/19/2017   AST 33 08/19/2017   ALT 25 08/19/2017   ALKPHOS 64 08/19/2017   BILITOT 0.6 08/19/2017   GFRNONAA >60 03/12/2019   GFRAA >60 03/12/2019    Lab Results  Component Value Date   WBC 9.7 03/17/2019   NEUTROABS 5.2 03/17/2019   HGB 8.4 (L) 03/17/2019   HCT 29.3 (L) 03/17/2019   MCV 72.9 (L) 03/17/2019   PLT 205 03/17/2019   Lab Results  Component Value Date   IRON 16 (L) 03/17/2019   TIBC 495 (H) 03/17/2019   IRONPCTSAT 3 (L) 03/17/2019   Lab Results  Component Value Date   FERRITIN 9 (L) 03/17/2019     STUDIES: No results found.  ASSESSMENT: Iron deficiency anemia.  PLAN:    1.  Iron deficiency anemia: Likely chronic and secondary to poor oral iron intake combined with heavy menses.  Patient's hemoglobin is improved since the emergency room, but still significantly decreased.  She also has decreased iron stores.  Proceed with 510 mg IV Feraheme today.  Return to clinic in 1 and 2 weeks for additional infusions.  Patient will then return to clinic in 8 weeks with repeat laboratory work, and further evaluation, and continuation of treatment if necessary.  If patient's hemoglobin and iron stores do not  improve with IV iron, will initiate full anemia work-up at that time.  I spent a total of 45 minutes face-to-face with the patient of which greater than 50% of the visit was spent in counseling and coordination of care as detailed above.   Patient expressed understanding and was in agreement with this plan. She also understands that She can call clinic at any time with any questions, concerns, or complaints.    Jeralyn Ruths, MD   05/07/2019 6:51 AM

## 2019-05-08 ENCOUNTER — Other Ambulatory Visit: Payer: Self-pay | Admitting: Oncology

## 2019-05-08 DIAGNOSIS — D509 Iron deficiency anemia, unspecified: Secondary | ICD-10-CM

## 2019-05-11 ENCOUNTER — Encounter: Payer: Self-pay | Admitting: Oncology

## 2019-05-11 ENCOUNTER — Inpatient Hospital Stay: Payer: Self-pay

## 2019-05-11 ENCOUNTER — Inpatient Hospital Stay: Payer: Self-pay | Admitting: Oncology

## 2019-06-19 NOTE — Progress Notes (Deleted)
[facility]  Telephone:([phone] [phone]  ID: Debbie Padilla OB: [birth year]  MR#: [id number]  ERD#:[id number]  Patient Care Team: Patient, No Pcp Per as PCP - General (General Practice)  CHIEF COMPLAINT: Iron deficiency anemia.  INTERVAL HISTORY: Patient is a 34 year old female who was recently evaluated in the emergency room for increasing weakness and fatigue.  She was found to have a profoundly decreased hemoglobin requiring 1 unit of packed red blood cells.  Patient feels slightly improved today, but not back to her baseline.  She has no neurologic complaints.  She denies any recent fevers or illnesses.  She has a good appetite and denies weight loss.  She has periodic chest pain, but denies cough or hemoptysis.  She does admit to occasional dyspnea on exertion and shortness of breath.  She denies any nausea, vomiting, constipation, or diarrhea.  She has no melena or hematochezia.  She has no urinary complaints.  Patient offers no further specific complaints today.  REVIEW OF SYSTEMS:   Review of Systems  Constitutional: Positive for malaise/fatigue. Negative for fever and weight loss.  Respiratory: Positive for shortness of breath. Negative for cough and hemoptysis.   Cardiovascular: Positive for chest pain. Negative for leg swelling.  Gastrointestinal: Negative.  Negative for abdominal pain and blood in stool.  Genitourinary: Negative.  Negative for hematuria.  Musculoskeletal: Negative.  Negative for back pain.  Skin: Negative.  Negative for rash.  Neurological: Positive for weakness. Negative for dizziness and headaches.  Psychiatric/Behavioral: Negative.  The patient is not nervous/anxious.     As per HPI. Otherwise, a complete review of systems is negative.  PAST MEDICAL HISTORY: Past Medical History:  Diagnosis Date  . Anemia   . Anxiety   . Bipolar disorder (HCC)   . Depression   . Hypertension    during pregnancy  . Preterm  labor     PAST SURGICAL HISTORY: Past Surgical History:  Procedure Laterality Date  . ANKLE INCISION AND DRAINAGE Left July 2012  . DILATION AND CURETTAGE OF UTERUS    . OPEN REDUCTION INTERNAL FIXATION (ORIF) PROXIMAL PHALANX Right 09/02/2018   Procedure: RIGHT OPEN REDUCTION INTERNAL FIXATION (ORIF) INDEX  PROXIMAL PHALANX;  Surgeon: Kennedy Bucker, MD;  Location: [facility] ORS;  Service: Orthopedics;  Laterality: Right;  . TUBAL LIGATION Bilateral 01/01/2016   Procedure: POST PARTUM TUBAL LIGATION;  Surgeon: [name], MD;  Location: [facility] ORS;  Service: Gynecology;  Laterality: Bilateral;    FAMILY HISTORY: Family History  Problem Relation Age of Onset  . Asthma Daughter   . Asthma Paternal Grandmother   . Diabetes Paternal Grandmother   . Crohn's disease Mother   . Irritable bowel syndrome Mother   . Heart disease Father   . Cancer Neg Hx     ADVANCED DIRECTIVES (Y/N):  N  HEALTH MAINTENANCE: Social History   Tobacco Use  . Smoking status: Current Every Day Smoker    Packs/day: 0.50    Years: 11.00    Pack years: 5.50    Types: Cigarettes  . Smokeless tobacco: Never Used  Substance Use Topics  . Alcohol use: Never  . Drug use: Not Currently    Types: Marijuana     Colonoscopy:  PAP:  Bone density:  Lipid panel:  No Known Allergies  Current Outpatient Medications  Medication Sig Dispense Refill  . docusate sodium (COLACE) 100 MG capsule Take 1 capsule (100 mg total) by mouth 2 (two) times daily. 60 capsule 1  . ferrous  sulfate 325 (65 FE) MG tablet Take 1 tablet (325 mg total) by mouth daily. 30 tablet 1   No current facility-administered medications for this visit.    OBJECTIVE: There were no vitals filed for this visit.   There is no height or weight on file to calculate BMI.    ECOG FS:1 - Symptomatic but completely ambulatory  General: Well-developed, well-nourished, no acute distress. Eyes: Pink conjunctiva, anicteric sclera. HEENT: Normocephalic,  moist mucous membranes, clear oropharnyx. Lungs: Clear to auscultation bilaterally. Heart: Regular rate and rhythm. No rubs, murmurs, or gallops. Abdomen: Soft, nontender, nondistended. No organomegaly noted, normoactive bowel sounds. Musculoskeletal: No edema, cyanosis, or clubbing. Neuro: Alert, answering all questions appropriately. Cranial nerves grossly intact. Skin: No rashes or petechiae noted. Psych: Normal affect. Lymphatics: No cervical, calvicular, axillary or inguinal LAD.   LAB RESULTS:  Lab Results  Component Value Date   NA 137 03/12/2019   K 4.6 03/12/2019   CL 105 03/12/2019   CO2 24 03/12/2019   GLUCOSE 103 (H) 03/12/2019   BUN 12 03/12/2019   CREATININE 0.82 03/12/2019   CALCIUM 8.9 03/12/2019   PROT 7.6 08/19/2017   ALBUMIN 4.2 08/19/2017   AST 33 08/19/2017   ALT 25 08/19/2017   ALKPHOS 64 08/19/2017   BILITOT 0.6 08/19/2017   GFRNONAA >60 03/12/2019   GFRAA >60 03/12/2019    Lab Results  Component Value Date   WBC 9.7 03/17/2019   NEUTROABS 5.2 03/17/2019   HGB 8.4 (L) 03/17/2019   HCT 29.3 (L) 03/17/2019   MCV 72.9 (L) 03/17/2019   PLT 205 03/17/2019   Lab Results  Component Value Date   IRON 16 (L) 03/17/2019   TIBC 495 (H) 03/17/2019   IRONPCTSAT 3 (L) 03/17/2019   Lab Results  Component Value Date   FERRITIN 9 (L) 03/17/2019     STUDIES: No results found.  ASSESSMENT: Iron deficiency anemia.  PLAN:    1.  Iron deficiency anemia: Likely chronic and secondary to poor oral iron intake combined with heavy menses.  Patient's hemoglobin is improved since the emergency room, but still significantly decreased.  She also has decreased iron stores.  Proceed with 510 mg IV Feraheme today.  Return to clinic in 1 and 2 weeks for additional infusions.  Patient will then return to clinic in 8 weeks with repeat laboratory work, and further evaluation, and continuation of treatment if necessary.  If patient's hemoglobin and iron stores do not  improve with IV iron, will initiate full anemia work-up at that time.  I spent a total of 45 minutes face-to-face with the patient of which greater than 50% of the visit was spent in counseling and coordination of care as detailed above.   Patient expressed understanding and was in agreement with this plan. She also understands that She can call clinic at any time with any questions, concerns, or complaints.    Lloyd Huger, MD   06/19/2019 1:03 PM

## 2019-06-26 ENCOUNTER — Inpatient Hospital Stay: Payer: Self-pay

## 2019-06-26 ENCOUNTER — Inpatient Hospital Stay: Payer: Self-pay | Admitting: Oncology

## 2019-06-26 ENCOUNTER — Encounter: Payer: Self-pay | Admitting: Oncology
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
## 2020-10-23 IMAGING — CR DG CHEST 2V
2 series · 2 of 2 positions shown · non-contrast
Comparison: 03/06/2018

CLINICAL DATA: Chest pain.

EXAM:
CHEST - 2 VIEW

[chest pa]
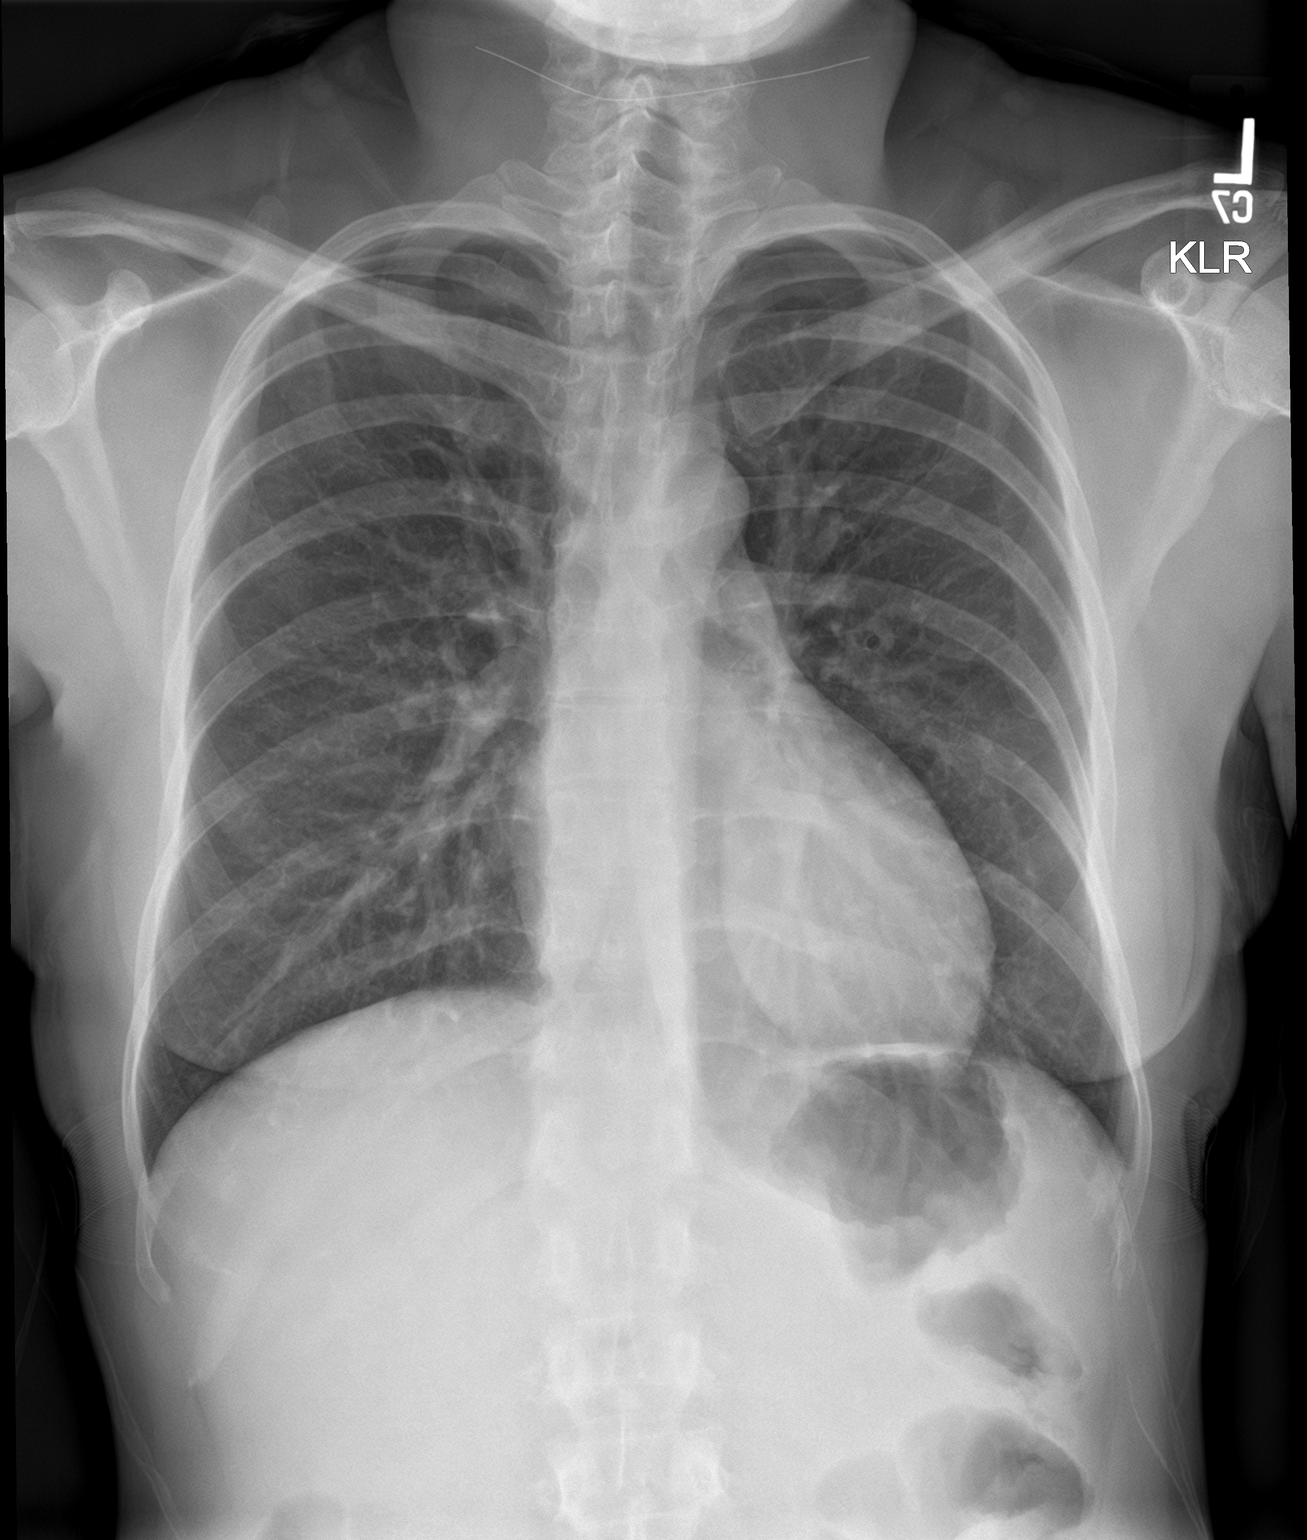

[chest lat]
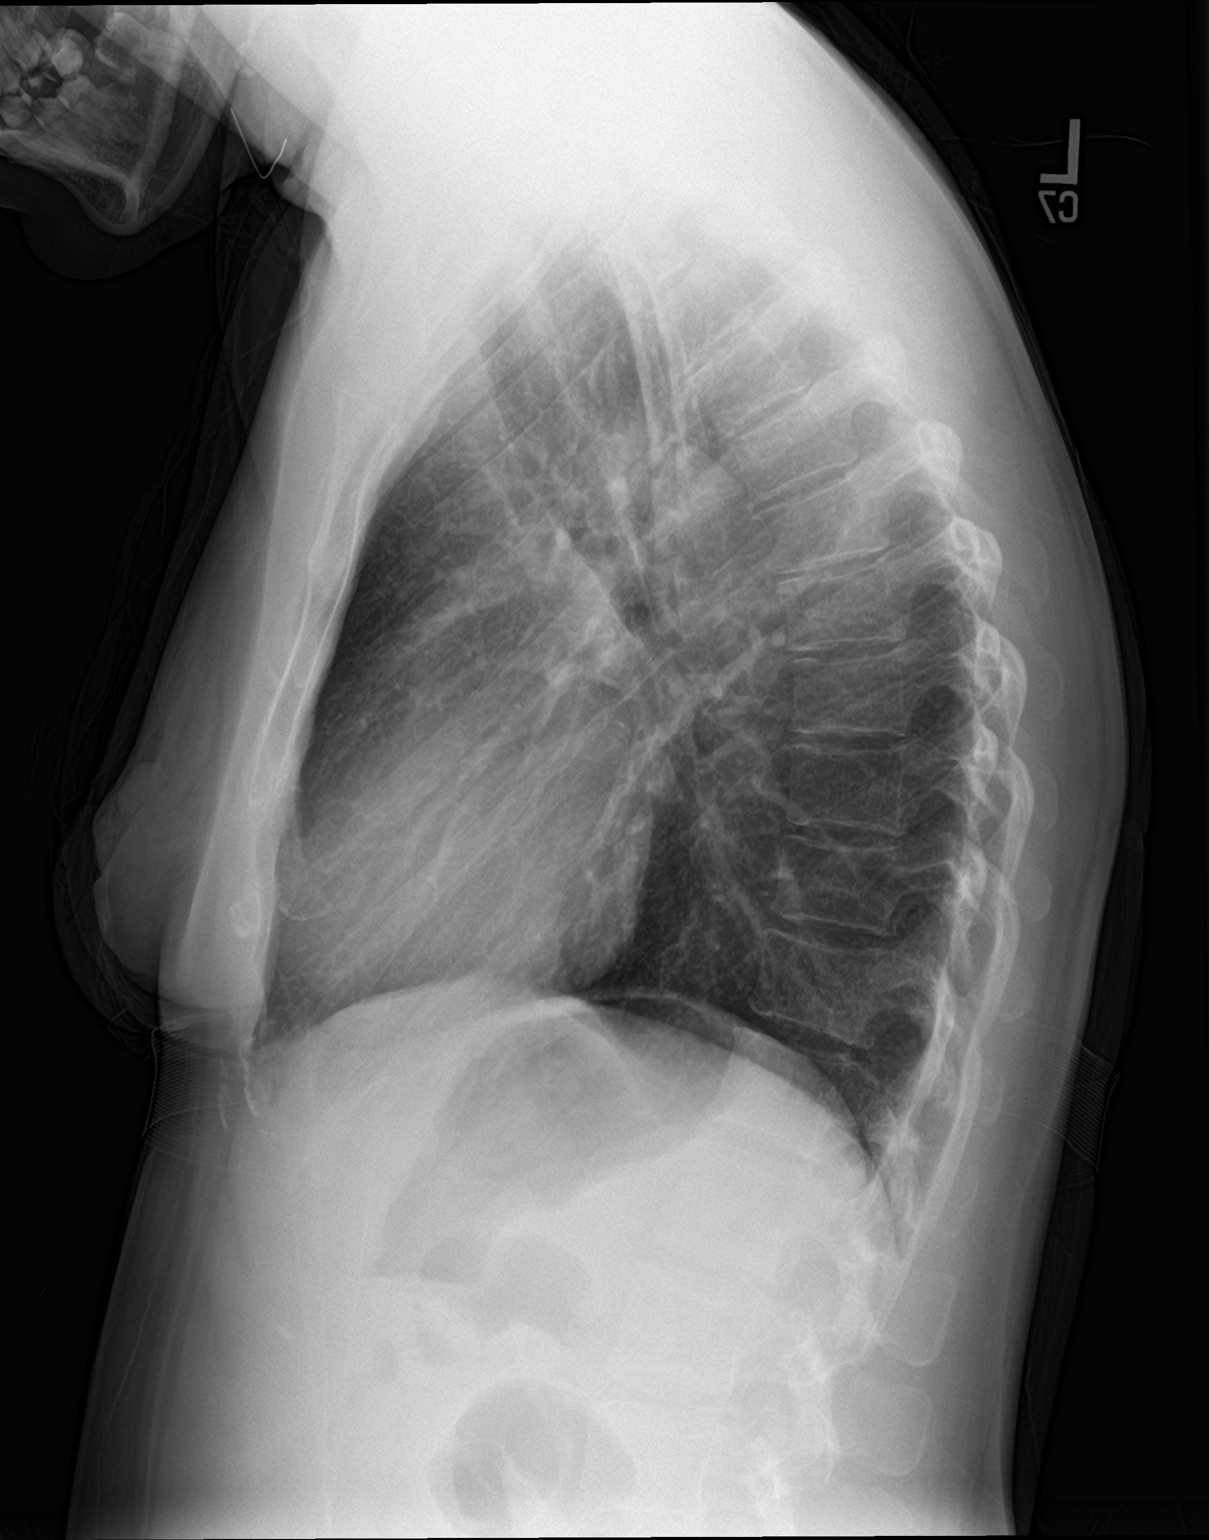

[2 of 2 positions shown; findings below may reference images not displayed]

FINDINGS: The heart size and mediastinal contours are within normal limits.
Both lungs are clear. Chronic coarsened interstitial markings are
noted bilaterally which may reflect smoking related changes. The
visualized skeletal structures are unremarkable.
IMPRESSION: 1. No active cardiopulmonary abnormalities.

## 2021-01-03 ENCOUNTER — Encounter: Payer: Self-pay | Admitting: Intensive Care

## 2021-01-03 ENCOUNTER — Other Ambulatory Visit: Payer: Self-pay

## 2021-01-03 ENCOUNTER — Emergency Department
Admission: EM | Admit: 2021-01-03 | Discharge: 2021-01-03 | Disposition: A | Discharge status: Home / Self Care | Payer: Self-pay | Attending: Emergency Medicine | Admitting: Emergency Medicine

## 2021-01-03 DIAGNOSIS — F17210 Nicotine dependence, cigarettes, uncomplicated: Secondary | ICD-10-CM | POA: Insufficient documentation

## 2021-01-03 DIAGNOSIS — K529 Noninfective gastroenteritis and colitis, unspecified: Secondary | ICD-10-CM | POA: Insufficient documentation

## 2021-01-03 DIAGNOSIS — I10 Essential (primary) hypertension: Secondary | ICD-10-CM | POA: Insufficient documentation

## 2021-01-03 LAB — COMPREHENSIVE METABOLIC PANEL
ALT: 36 U/L (ref 0–44)
AST: 39 U/L (ref 15–41)
Albumin: 4.4 g/dL (ref 3.5–5.0)
Alkaline Phosphatase: 73 U/L (ref 38–126)
Anion gap: 10 (ref 5–15)
BUN: 11 mg/dL (ref 6–20)
CO2: 22 mmol/L (ref 22–32)
Calcium: 8.9 mg/dL (ref 8.9–10.3)
Chloride: 102 mmol/L (ref 98–111)
Creatinine, Ser: 0.82 mg/dL (ref 0.44–1.00)
GFR, Estimated: >60 mL/min (ref >60)
Glucose, Bld: 135 mg/dL — ABNORMAL HIGH (ref 70–99)
Potassium: 4.5 mmol/L (ref 3.5–5.1)
Sodium: 134 mmol/L — ABNORMAL LOW (ref 135–145)
Total Bilirubin: 1 mg/dL (ref 0.3–1.2)
Total Protein: 7.3 g/dL (ref 6.5–8.1)

## 2021-01-03 LAB — CBC
HCT: 43.9 % (ref 36.0–46.0)
Hemoglobin: 15 g/dL (ref 12.0–15.0)
MCH: 31.6 pg (ref 26.0–34.0)
MCHC: 34.2 g/dL (ref 30.0–36.0)
MCV: 92.4 fL (ref 80.0–100.0)
Platelets: 192 10*3/uL (ref 150–400)
RBC: 4.75 MIL/uL (ref 3.87–5.11)
RDW: 13.1 % (ref 11.5–15.5)
WBC: 17.9 10*3/uL — ABNORMAL HIGH (ref 4.0–10.5)
nRBC: 0 % (ref 0.0–0.2)

## 2021-01-03 LAB — LIPASE, BLOOD: Lipase: 22 U/L (ref 11–51)

## 2021-01-03 MED ORDER — ONDANSETRON 4 MG PO TBDP: 4.0000 mg | ORAL_TABLET | Freq: Three times a day (TID) | ORAL | 0 refills | Status: AC | PRN

## 2021-01-03 NOTE — ED Provider Notes (Signed)
[facility] Emergency Department Provider Note   ____________________________________________    I have reviewed the triage vital signs and the nursing notes.   HISTORY  Chief Complaint Abdominal Pain     HPI Debbie Padilla is a 35 y.o. female with history as below who presents with complaints of nausea and vomiting.  Patient reports he woke up this morning, immediately felt nauseated and vomited numerous times.  She had 1 episode of diarrhea as well.  She reports she received Zofran via EMS and is feeling much better now.  No abdominal pain.  No fevers or chills.  No sick contacts reported.  She suspects she may have had food poisoning.  Past Medical History:  Diagnosis Date   Anemia    Anxiety    Bipolar disorder (HCC)    Depression    Hypertension    during pregnancy   Preterm labor     Patient Active Problem List   Diagnosis Date Noted   Iron deficiency anemia 03/16/2019   Gestational thrombocytopenia (HCC) 01/01/2016   Abdominal pain 12/31/2015   Active labor at term 12/31/2015   Positive GBS test 12/27/2015   Bipolar disorder (HCC) 12/07/2015   H/O premature rupture of membranes in previous pregnancy, currently pregnant 11/10/2015   H/O preterm delivery, currently pregnant 11/10/2015   Prenatal care insufficient 11/10/2015   Short interval between pregnancies affecting pregnancy in third trimester, antepartum 11/10/2015   Abnormal drug screen 09/27/2015   Tobacco abuse 10/06/2014    Past Surgical History:  Procedure Laterality Date   ANKLE INCISION AND DRAINAGE Left July 2012   DILATION AND CURETTAGE OF UTERUS     OPEN REDUCTION INTERNAL FIXATION (ORIF) PROXIMAL PHALANX Right 09/02/2018   Procedure: RIGHT OPEN REDUCTION INTERNAL FIXATION (ORIF) INDEX  PROXIMAL PHALANX;  Surgeon: Kennedy Bucker, MD;  Location: [facility] ORS;  Service: Orthopedics;  Laterality: Right;   TUBAL LIGATION Bilateral 01/01/2016   Procedure: POST PARTUM  TUBAL LIGATION;  Surgeon: [name], MD;  Location: [facility] ORS;  Service: Gynecology;  Laterality: Bilateral;    Prior to Admission medications   Medication Sig Start Date End Date Taking? Authorizing Provider  ondansetron (ZOFRAN ODT) 4 MG disintegrating tablet Take 1 tablet (4 mg total) by mouth every 8 (eight) hours as needed. 01/03/21  Yes Jene Every, MD  ferrous sulfate 325 (65 FE) MG tablet Take 1 tablet (325 mg total) by mouth daily. 03/12/19 05/11/19  Chesley Noon, MD     Allergies Patient has no known allergies.  Family History  Problem Relation Age of Onset   Asthma Daughter    Asthma Paternal Grandmother    Diabetes Paternal Grandmother    Crohn's disease Mother    Irritable bowel syndrome Mother    Heart disease Father    Cancer Neg Hx     Social History Social History   Tobacco Use   Smoking status: Every Day    Packs/day: 0.50    Years: 11.00    Pack years: 5.50    Types: Cigarettes   Smokeless tobacco: Never  Vaping Use   Vaping Use: Never used  Substance Use Topics   Alcohol use: Never   Drug use: Yes    Types: Marijuana    Review of Systems  Constitutional: No fever/chills Eyes: No visual changes.  ENT: No sore throat. Cardiovascular: Denies chest pain. Respiratory: Denies shortness of breath. Gastrointestinal: As above Genitourinary: Negative for dysuria. Musculoskeletal: Negative for back pain. Skin: Negative for rash. Neurological:  Negative for headaches or weakness   ____________________________________________   PHYSICAL EXAM:  VITAL SIGNS: ED Triage Vitals  Enc Vitals Group     BP 01/03/21 1223 (!) 141/88     Pulse Rate 01/03/21 1223 63     Resp 01/03/21 1223 16     Temp 01/03/21 1223 98.7 F (37.1 C)     Temp Source 01/03/21 1223 Oral     SpO2 01/03/21 1223 99 %     Weight 01/03/21 1222 83 kg (183 lb)     Height 01/03/21 1222 1.6 m (5\' 3" )     Head Circumference --      Peak Flow --      Pain Score 01/03/21 1222  7     Pain Loc --      Pain Edu? --      Excl. in GC? --     Constitutional: Alert and oriented.   Nose: No congestion/rhinnorhea. Mouth/Throat: Mucous membranes are moist.    Cardiovascular: Normal rate, regular rhythm.   Good peripheral circulation. Respiratory: Normal respiratory effort.  No retractions.  Gastrointestinal: Soft and nontender. No distention.  No CVA tenderness.  Reassuring exam  Musculoskeletal: No lower extremity tenderness nor edema.  Warm and well perfused Neurologic:  Normal speech and language. No gross focal neurologic deficits are appreciated.  Skin:  Skin is warm, dry and intact. No rash noted. Psychiatric: Mood and affect are normal. Speech and behavior are normal.  ____________________________________________   LABS (all labs ordered are listed, but only abnormal results are displayed)  Labs Reviewed  COMPREHENSIVE METABOLIC PANEL - Abnormal; Notable for the following components:      Result Value   Sodium 134 (*)    Glucose, Bld 135 (*)    All other components within normal limits  CBC - Abnormal; Notable for the following components:   WBC 17.9 (*)    All other components within normal limits  LIPASE, BLOOD  URINALYSIS, COMPLETE (UACMP) WITH MICROSCOPIC  POC URINE PREG, ED   ____________________________________________  None ____________________________________________  RADIOLOGY   ____________________________________________   PROCEDURES  Procedure(s) performed: No  Procedures   Critical Care performed: No ____________________________________________   INITIAL IMPRESSION / ASSESSMENT AND PLAN / ED COURSE  Pertinent labs & imaging results that were available during my care of the patient were reviewed by me and considered in my medical decision making (see chart for details).   Patient well-appearing and in no acute distress, she reports significant improvement in symptoms.  Suspicious for viral gastroenteritis versus  foodborne illness.  Lab work notable for elevated white blood cell count consistent with gastroenteritis/frequent vomiting abdominal exam is reassuring, electrolytes unremarkable, LFTs are normal.  Appropriate for discharge at this time with supportive care    ____________________________________________   FINAL CLINICAL IMPRESSION(S) / ED DIAGNOSES  Final diagnoses:  Gastroenteritis        Note:  This document was prepared using Dragon voice recognition software and may include unintentional dictation errors.    01/05/21, MD 01/03/21 [id number]

## 2021-01-03 NOTE — ED Triage Notes (Signed)
Patient c/o abdominal pain with N/V that started this AM. Arrived by EMS from home

## 2022-01-24 DIAGNOSIS — Z79899 Other long term (current) drug therapy: Secondary | ICD-10-CM | POA: Diagnosis not present

## 2022-01-24 DIAGNOSIS — Z1339 Encounter for screening examination for other mental health and behavioral disorders: Secondary | ICD-10-CM | POA: Diagnosis not present

## 2022-01-24 DIAGNOSIS — R69 Illness, unspecified: Secondary | ICD-10-CM | POA: Diagnosis not present

## 2022-02-21 DIAGNOSIS — Z1339 Encounter for screening examination for other mental health and behavioral disorders: Secondary | ICD-10-CM | POA: Diagnosis not present

## 2022-02-21 DIAGNOSIS — R69 Illness, unspecified: Secondary | ICD-10-CM | POA: Diagnosis not present

## 2022-02-21 DIAGNOSIS — Z79899 Other long term (current) drug therapy: Secondary | ICD-10-CM | POA: Diagnosis not present

## 2022-03-21 DIAGNOSIS — Z1339 Encounter for screening examination for other mental health and behavioral disorders: Secondary | ICD-10-CM | POA: Diagnosis not present

## 2022-03-21 DIAGNOSIS — F1110 Opioid abuse, uncomplicated: Secondary | ICD-10-CM | POA: Diagnosis not present

## 2022-04-18 DIAGNOSIS — F1110 Opioid abuse, uncomplicated: Secondary | ICD-10-CM | POA: Diagnosis not present

## 2022-04-18 DIAGNOSIS — Z1339 Encounter for screening examination for other mental health and behavioral disorders: Secondary | ICD-10-CM | POA: Diagnosis not present

## 2022-04-20 DIAGNOSIS — R69 Illness, unspecified: Secondary | ICD-10-CM | POA: Diagnosis not present

## 2022-04-20 DIAGNOSIS — Z13220 Encounter for screening for lipoid disorders: Secondary | ICD-10-CM | POA: Diagnosis not present

## 2022-04-20 DIAGNOSIS — E6609 Other obesity due to excess calories: Secondary | ICD-10-CM | POA: Diagnosis not present

## 2022-04-20 DIAGNOSIS — D508 Other iron deficiency anemias: Secondary | ICD-10-CM | POA: Diagnosis not present

## 2022-04-20 DIAGNOSIS — Z7689 Persons encountering health services in other specified circumstances: Secondary | ICD-10-CM | POA: Diagnosis not present

## 2022-04-20 DIAGNOSIS — R030 Elevated blood-pressure reading, without diagnosis of hypertension: Secondary | ICD-10-CM | POA: Diagnosis not present

## 2022-05-18 DIAGNOSIS — F1110 Opioid abuse, uncomplicated: Secondary | ICD-10-CM | POA: Diagnosis not present

## 2022-06-30 DIAGNOSIS — R110 Nausea: Secondary | ICD-10-CM | POA: Diagnosis not present

## 2022-07-12 DIAGNOSIS — F1110 Opioid abuse, uncomplicated: Secondary | ICD-10-CM | POA: Diagnosis not present

## 2022-07-12 DIAGNOSIS — Z79899 Other long term (current) drug therapy: Secondary | ICD-10-CM | POA: Diagnosis not present

## 2022-07-12 DIAGNOSIS — Z1339 Encounter for screening examination for other mental health and behavioral disorders: Secondary | ICD-10-CM | POA: Diagnosis not present

## 2022-08-09 DIAGNOSIS — F1120 Opioid dependence, uncomplicated: Secondary | ICD-10-CM | POA: Diagnosis not present

## 2022-08-09 DIAGNOSIS — Z79899 Other long term (current) drug therapy: Secondary | ICD-10-CM | POA: Diagnosis not present

## 2022-08-10 DIAGNOSIS — F329 Major depressive disorder, single episode, unspecified: Secondary | ICD-10-CM | POA: Diagnosis not present

## 2022-08-10 DIAGNOSIS — F1120 Opioid dependence, uncomplicated: Secondary | ICD-10-CM | POA: Diagnosis not present

## 2022-08-30 DIAGNOSIS — D508 Other iron deficiency anemias: Secondary | ICD-10-CM | POA: Diagnosis not present

## 2022-08-30 DIAGNOSIS — I10 Essential (primary) hypertension: Secondary | ICD-10-CM | POA: Diagnosis not present

## 2022-09-06 DIAGNOSIS — Z79899 Other long term (current) drug therapy: Secondary | ICD-10-CM | POA: Diagnosis not present

## 2022-09-06 DIAGNOSIS — F1120 Opioid dependence, uncomplicated: Secondary | ICD-10-CM | POA: Diagnosis not present

## 2022-09-06 DIAGNOSIS — F329 Major depressive disorder, single episode, unspecified: Secondary | ICD-10-CM | POA: Diagnosis not present

## 2022-09-07 DIAGNOSIS — F329 Major depressive disorder, single episode, unspecified: Secondary | ICD-10-CM | POA: Diagnosis not present

## 2022-09-07 DIAGNOSIS — F1120 Opioid dependence, uncomplicated: Secondary | ICD-10-CM | POA: Diagnosis not present

## 2022-10-03 DIAGNOSIS — F329 Major depressive disorder, single episode, unspecified: Secondary | ICD-10-CM | POA: Diagnosis not present

## 2022-10-03 DIAGNOSIS — F1120 Opioid dependence, uncomplicated: Secondary | ICD-10-CM | POA: Diagnosis not present

## 2022-10-23 DIAGNOSIS — E6609 Other obesity due to excess calories: Secondary | ICD-10-CM | POA: Diagnosis not present

## 2022-10-23 DIAGNOSIS — F908 Attention-deficit hyperactivity disorder, other type: Secondary | ICD-10-CM | POA: Diagnosis not present

## 2022-10-23 DIAGNOSIS — I10 Essential (primary) hypertension: Secondary | ICD-10-CM | POA: Diagnosis not present

## 2022-10-23 DIAGNOSIS — R110 Nausea: Secondary | ICD-10-CM | POA: Diagnosis not present

## 2022-10-23 DIAGNOSIS — F3160 Bipolar disorder, current episode mixed, unspecified: Secondary | ICD-10-CM | POA: Diagnosis not present

## 2022-10-30 DIAGNOSIS — F1120 Opioid dependence, uncomplicated: Secondary | ICD-10-CM | POA: Diagnosis not present

## 2022-10-30 DIAGNOSIS — Z79899 Other long term (current) drug therapy: Secondary | ICD-10-CM | POA: Diagnosis not present

## 2022-10-31 DIAGNOSIS — F411 Generalized anxiety disorder: Secondary | ICD-10-CM | POA: Diagnosis not present

## 2022-10-31 DIAGNOSIS — F329 Major depressive disorder, single episode, unspecified: Secondary | ICD-10-CM | POA: Diagnosis not present

## 2022-10-31 DIAGNOSIS — F1120 Opioid dependence, uncomplicated: Secondary | ICD-10-CM | POA: Diagnosis not present

## 2022-11-28 DIAGNOSIS — F1120 Opioid dependence, uncomplicated: Secondary | ICD-10-CM | POA: Diagnosis not present

## 2022-11-28 DIAGNOSIS — F329 Major depressive disorder, single episode, unspecified: Secondary | ICD-10-CM | POA: Diagnosis not present

## 2022-12-26 DIAGNOSIS — F1120 Opioid dependence, uncomplicated: Secondary | ICD-10-CM | POA: Diagnosis not present

## 2022-12-26 DIAGNOSIS — F329 Major depressive disorder, single episode, unspecified: Secondary | ICD-10-CM | POA: Diagnosis not present

## 2023-01-23 DIAGNOSIS — F17290 Nicotine dependence, other tobacco product, uncomplicated: Secondary | ICD-10-CM | POA: Diagnosis not present

## 2023-01-23 DIAGNOSIS — F329 Major depressive disorder, single episode, unspecified: Secondary | ICD-10-CM | POA: Diagnosis not present

## 2023-01-23 DIAGNOSIS — I10 Essential (primary) hypertension: Secondary | ICD-10-CM | POA: Diagnosis not present

## 2023-01-23 DIAGNOSIS — R110 Nausea: Secondary | ICD-10-CM | POA: Diagnosis not present

## 2023-01-23 DIAGNOSIS — F908 Attention-deficit hyperactivity disorder, other type: Secondary | ICD-10-CM | POA: Diagnosis not present

## 2023-01-23 DIAGNOSIS — F1120 Opioid dependence, uncomplicated: Secondary | ICD-10-CM | POA: Diagnosis not present

## 2023-02-20 DIAGNOSIS — F411 Generalized anxiety disorder: Secondary | ICD-10-CM | POA: Diagnosis not present

## 2023-02-20 DIAGNOSIS — F1120 Opioid dependence, uncomplicated: Secondary | ICD-10-CM | POA: Diagnosis not present

## 2023-03-20 DIAGNOSIS — F329 Major depressive disorder, single episode, unspecified: Secondary | ICD-10-CM | POA: Diagnosis not present

## 2023-03-20 DIAGNOSIS — F1120 Opioid dependence, uncomplicated: Secondary | ICD-10-CM | POA: Diagnosis not present

## 2023-03-20 DIAGNOSIS — F411 Generalized anxiety disorder: Secondary | ICD-10-CM | POA: Diagnosis not present

## 2023-03-21 DIAGNOSIS — I10 Essential (primary) hypertension: Secondary | ICD-10-CM | POA: Diagnosis not present

## 2023-03-21 DIAGNOSIS — D508 Other iron deficiency anemias: Secondary | ICD-10-CM | POA: Diagnosis not present

## 2023-03-21 DIAGNOSIS — Z13220 Encounter for screening for lipoid disorders: Secondary | ICD-10-CM | POA: Diagnosis not present

## 2023-03-21 DIAGNOSIS — F3160 Bipolar disorder, current episode mixed, unspecified: Secondary | ICD-10-CM | POA: Diagnosis not present

## 2023-03-21 DIAGNOSIS — E6609 Other obesity due to excess calories: Secondary | ICD-10-CM | POA: Diagnosis not present

## 2023-04-17 DIAGNOSIS — F329 Major depressive disorder, single episode, unspecified: Secondary | ICD-10-CM | POA: Diagnosis not present

## 2023-04-17 DIAGNOSIS — F1120 Opioid dependence, uncomplicated: Secondary | ICD-10-CM | POA: Diagnosis not present

## 2023-04-17 DIAGNOSIS — F411 Generalized anxiety disorder: Secondary | ICD-10-CM | POA: Diagnosis not present

## 2023-05-02 DIAGNOSIS — I129 Hypertensive chronic kidney disease with stage 1 through stage 4 chronic kidney disease, or unspecified chronic kidney disease: Secondary | ICD-10-CM | POA: Diagnosis not present

## 2023-05-02 DIAGNOSIS — K219 Gastro-esophageal reflux disease without esophagitis: Secondary | ICD-10-CM | POA: Diagnosis not present

## 2023-05-02 DIAGNOSIS — N182 Chronic kidney disease, stage 2 (mild): Secondary | ICD-10-CM | POA: Diagnosis not present

## 2023-05-02 DIAGNOSIS — Z6835 Body mass index (BMI) 35.0-35.9, adult: Secondary | ICD-10-CM | POA: Diagnosis not present

## 2023-05-02 DIAGNOSIS — F909 Attention-deficit hyperactivity disorder, unspecified type: Secondary | ICD-10-CM | POA: Diagnosis not present

## 2023-05-02 DIAGNOSIS — F1121 Opioid dependence, in remission: Secondary | ICD-10-CM | POA: Diagnosis not present

## 2023-05-02 DIAGNOSIS — J449 Chronic obstructive pulmonary disease, unspecified: Secondary | ICD-10-CM | POA: Diagnosis not present

## 2023-05-02 DIAGNOSIS — Z791 Long term (current) use of non-steroidal anti-inflammatories (NSAID): Secondary | ICD-10-CM | POA: Diagnosis not present

## 2023-05-02 DIAGNOSIS — F319 Bipolar disorder, unspecified: Secondary | ICD-10-CM | POA: Diagnosis not present

## 2023-05-02 DIAGNOSIS — Z818 Family history of other mental and behavioral disorders: Secondary | ICD-10-CM | POA: Diagnosis not present

## 2023-05-02 DIAGNOSIS — F17210 Nicotine dependence, cigarettes, uncomplicated: Secondary | ICD-10-CM | POA: Diagnosis not present

## 2023-05-16 DIAGNOSIS — F329 Major depressive disorder, single episode, unspecified: Secondary | ICD-10-CM | POA: Diagnosis not present

## 2023-05-16 DIAGNOSIS — F1120 Opioid dependence, uncomplicated: Secondary | ICD-10-CM | POA: Diagnosis not present
